# Patient Record
Sex: Female | Born: 1970
Health system: Southern US, Community
[De-identification: ages and names within clinical notes are randomized; demographics above are authoritative.]

## PROBLEM LIST (undated history)

## (undated) DIAGNOSIS — F32A Depression, unspecified: Secondary | ICD-10-CM

## (undated) DIAGNOSIS — K645 Perianal venous thrombosis: Secondary | ICD-10-CM

## (undated) DIAGNOSIS — G248 Other dystonia: Secondary | ICD-10-CM

## (undated) DIAGNOSIS — E229 Hyperfunction of pituitary gland, unspecified: Secondary | ICD-10-CM

## (undated) DIAGNOSIS — D239 Other benign neoplasm of skin, unspecified: Secondary | ICD-10-CM

## (undated) DIAGNOSIS — G43719 Chronic migraine without aura, intractable, without status migrainosus: Secondary | ICD-10-CM

## (undated) DIAGNOSIS — E018 Other iodine-deficiency related thyroid disorders and allied conditions: Secondary | ICD-10-CM

## (undated) DIAGNOSIS — F419 Anxiety disorder, unspecified: Secondary | ICD-10-CM

## (undated) DIAGNOSIS — L219 Seborrheic dermatitis, unspecified: Secondary | ICD-10-CM

## (undated) DIAGNOSIS — G241 Genetic torsion dystonia: Secondary | ICD-10-CM

## (undated) DIAGNOSIS — N926 Irregular menstruation, unspecified: Secondary | ICD-10-CM

## (undated) DIAGNOSIS — D708 Other neutropenia: Secondary | ICD-10-CM

## (undated) DIAGNOSIS — I959 Hypotension, unspecified: Secondary | ICD-10-CM

## (undated) DIAGNOSIS — R269 Unspecified abnormalities of gait and mobility: Secondary | ICD-10-CM

## (undated) HISTORY — DX: Other iodine-deficiency related thyroid disorders and allied conditions: E01.8

## (undated) HISTORY — DX: Irregular menstruation, unspecified: N92.6

## (undated) HISTORY — DX: Seborrheic dermatitis, unspecified: L21.9

## (undated) HISTORY — DX: Other benign neoplasm of skin, unspecified: D23.9

## (undated) HISTORY — DX: Genetic torsion dystonia: G24.1

## (undated) HISTORY — PX: HYMENECTOMY: SHX987

## (undated) HISTORY — DX: Perianal venous thrombosis: K64.5

## (undated) HISTORY — DX: Hyperfunction of pituitary gland, unspecified: E22.9

## (undated) HISTORY — DX: Chronic migraine without aura, intractable, without status migrainosus: G43.719

## (undated) HISTORY — DX: Unspecified abnormalities of gait and mobility: R26.9

## (undated) HISTORY — DX: Other neutropenia: D70.8

## (undated) HISTORY — DX: Other dystonia: G24.8

---

## 2010-03-31 DIAGNOSIS — L219 Seborrheic dermatitis, unspecified: Secondary | ICD-10-CM | POA: Insufficient documentation

## 2010-05-05 DIAGNOSIS — E018 Other iodine-deficiency related thyroid disorders and allied conditions: Secondary | ICD-10-CM | POA: Insufficient documentation

## 2010-07-27 DIAGNOSIS — R197 Diarrhea, unspecified: Secondary | ICD-10-CM | POA: Insufficient documentation

## 2010-07-27 DIAGNOSIS — D708 Other neutropenia: Secondary | ICD-10-CM | POA: Insufficient documentation

## 2010-09-30 DIAGNOSIS — D239 Other benign neoplasm of skin, unspecified: Secondary | ICD-10-CM | POA: Insufficient documentation

## 2010-11-24 DIAGNOSIS — M25569 Pain in unspecified knee: Secondary | ICD-10-CM | POA: Insufficient documentation

## 2011-03-18 DIAGNOSIS — R269 Unspecified abnormalities of gait and mobility: Secondary | ICD-10-CM | POA: Insufficient documentation

## 2011-04-01 DIAGNOSIS — G241 Genetic torsion dystonia: Secondary | ICD-10-CM | POA: Insufficient documentation

## 2013-07-05 DIAGNOSIS — Z01419 Encounter for gynecological examination (general) (routine) without abnormal findings: Secondary | ICD-10-CM | POA: Insufficient documentation

## 2014-04-22 DIAGNOSIS — N926 Irregular menstruation, unspecified: Secondary | ICD-10-CM | POA: Insufficient documentation

## 2016-09-23 DIAGNOSIS — G43719 Chronic migraine without aura, intractable, without status migrainosus: Secondary | ICD-10-CM | POA: Insufficient documentation

## 2017-05-17 DIAGNOSIS — R634 Abnormal weight loss: Secondary | ICD-10-CM | POA: Insufficient documentation

## 2017-06-24 DIAGNOSIS — K645 Perianal venous thrombosis: Secondary | ICD-10-CM | POA: Insufficient documentation

## 2017-11-03 DIAGNOSIS — Z23 Encounter for immunization: Secondary | ICD-10-CM | POA: Diagnosis not present

## 2017-11-15 DIAGNOSIS — D839 Common variable immunodeficiency, unspecified: Secondary | ICD-10-CM | POA: Insufficient documentation

## 2017-11-15 DIAGNOSIS — G248 Other dystonia: Secondary | ICD-10-CM | POA: Diagnosis not present

## 2017-12-28 DIAGNOSIS — F331 Major depressive disorder, recurrent, moderate: Secondary | ICD-10-CM | POA: Diagnosis not present

## 2018-01-03 DIAGNOSIS — F331 Major depressive disorder, recurrent, moderate: Secondary | ICD-10-CM | POA: Diagnosis not present

## 2018-01-12 DIAGNOSIS — F331 Major depressive disorder, recurrent, moderate: Secondary | ICD-10-CM | POA: Diagnosis not present

## 2018-01-19 DIAGNOSIS — F331 Major depressive disorder, recurrent, moderate: Secondary | ICD-10-CM | POA: Diagnosis not present

## 2018-01-25 DIAGNOSIS — F331 Major depressive disorder, recurrent, moderate: Secondary | ICD-10-CM | POA: Diagnosis not present

## 2018-02-01 DIAGNOSIS — F331 Major depressive disorder, recurrent, moderate: Secondary | ICD-10-CM | POA: Diagnosis not present

## 2018-02-03 DIAGNOSIS — G248 Other dystonia: Secondary | ICD-10-CM | POA: Diagnosis not present

## 2018-02-08 DIAGNOSIS — F331 Major depressive disorder, recurrent, moderate: Secondary | ICD-10-CM | POA: Diagnosis not present

## 2018-03-07 DIAGNOSIS — D839 Common variable immunodeficiency, unspecified: Secondary | ICD-10-CM | POA: Diagnosis not present

## 2018-03-07 DIAGNOSIS — G249 Dystonia, unspecified: Secondary | ICD-10-CM | POA: Diagnosis not present

## 2018-03-07 DIAGNOSIS — Z923 Personal history of irradiation: Secondary | ICD-10-CM | POA: Diagnosis not present

## 2018-03-07 DIAGNOSIS — G43009 Migraine without aura, not intractable, without status migrainosus: Secondary | ICD-10-CM | POA: Diagnosis not present

## 2018-03-07 DIAGNOSIS — F3341 Major depressive disorder, recurrent, in partial remission: Secondary | ICD-10-CM | POA: Diagnosis not present

## 2018-03-07 DIAGNOSIS — Z23 Encounter for immunization: Secondary | ICD-10-CM | POA: Diagnosis not present

## 2018-04-11 DIAGNOSIS — M7022 Olecranon bursitis, left elbow: Secondary | ICD-10-CM | POA: Diagnosis not present

## 2018-04-11 DIAGNOSIS — Z23 Encounter for immunization: Secondary | ICD-10-CM | POA: Diagnosis not present

## 2018-04-18 DIAGNOSIS — Z681 Body mass index (BMI) 19 or less, adult: Secondary | ICD-10-CM | POA: Diagnosis not present

## 2018-04-18 DIAGNOSIS — E890 Postprocedural hypothyroidism: Secondary | ICD-10-CM | POA: Diagnosis not present

## 2018-04-18 DIAGNOSIS — G4700 Insomnia, unspecified: Secondary | ICD-10-CM | POA: Diagnosis not present

## 2018-04-18 DIAGNOSIS — Z8639 Personal history of other endocrine, nutritional and metabolic disease: Secondary | ICD-10-CM | POA: Diagnosis not present

## 2018-04-21 DIAGNOSIS — G248 Other dystonia: Secondary | ICD-10-CM | POA: Diagnosis not present

## 2018-04-25 DIAGNOSIS — F332 Major depressive disorder, recurrent severe without psychotic features: Secondary | ICD-10-CM | POA: Diagnosis not present

## 2018-04-26 DIAGNOSIS — M7022 Olecranon bursitis, left elbow: Secondary | ICD-10-CM | POA: Diagnosis not present

## 2018-04-26 DIAGNOSIS — G43009 Migraine without aura, not intractable, without status migrainosus: Secondary | ICD-10-CM | POA: Diagnosis not present

## 2018-04-26 DIAGNOSIS — Z0000 Encounter for general adult medical examination without abnormal findings: Secondary | ICD-10-CM | POA: Diagnosis not present

## 2018-04-26 DIAGNOSIS — D839 Common variable immunodeficiency, unspecified: Secondary | ICD-10-CM | POA: Diagnosis not present

## 2018-04-27 DIAGNOSIS — F330 Major depressive disorder, recurrent, mild: Secondary | ICD-10-CM | POA: Diagnosis not present

## 2018-05-02 DIAGNOSIS — F332 Major depressive disorder, recurrent severe without psychotic features: Secondary | ICD-10-CM | POA: Diagnosis not present

## 2018-05-09 DIAGNOSIS — F411 Generalized anxiety disorder: Secondary | ICD-10-CM | POA: Diagnosis not present

## 2018-05-10 ENCOUNTER — Telehealth: Payer: Self-pay | Admitting: Hematology

## 2018-05-10 ENCOUNTER — Encounter: Payer: Self-pay | Admitting: Hematology

## 2018-05-10 NOTE — Telephone Encounter (Signed)
New referral received from Marda Stalker, PA at [facility] for dx of immunoglobulin deficiency. Pt has been cld and scheduled to see Dr. Maylon Peppers on 12/17 at 1pm. Pt aware to arrive 30 minutes early. Letter mailed.

## 2018-05-11 ENCOUNTER — Encounter: Payer: Self-pay | Admitting: Neurology

## 2018-05-11 ENCOUNTER — Ambulatory Visit (INDEPENDENT_AMBULATORY_CARE_PROVIDER_SITE_OTHER): Payer: BLUE CROSS/BLUE SHIELD | Admitting: Neurology

## 2018-05-11 VITALS — BP 95/58 | HR 72 | Ht 65.75 in | Wt 118.8 lb

## 2018-05-11 DIAGNOSIS — IMO0002 Reserved for concepts with insufficient information to code with codable children: Secondary | ICD-10-CM

## 2018-05-11 DIAGNOSIS — G43709 Chronic migraine without aura, not intractable, without status migrainosus: Secondary | ICD-10-CM | POA: Diagnosis not present

## 2018-05-12 DIAGNOSIS — G43709 Chronic migraine without aura, not intractable, without status migrainosus: Secondary | ICD-10-CM | POA: Insufficient documentation

## 2018-05-12 DIAGNOSIS — M7022 Olecranon bursitis, left elbow: Secondary | ICD-10-CM | POA: Diagnosis not present

## 2018-05-12 DIAGNOSIS — M25522 Pain in left elbow: Secondary | ICD-10-CM | POA: Diagnosis not present

## 2018-05-12 DIAGNOSIS — M7030 Other bursitis of elbow, unspecified elbow: Secondary | ICD-10-CM | POA: Insufficient documentation

## 2018-05-12 MED ORDER — RIZATRIPTAN BENZOATE 10 MG PO TBDP: 10.0000 mg | ORAL_TABLET | ORAL | 6 refills | Status: DC | PRN

## 2018-05-12 MED ORDER — FREMANEZUMAB-VFRM 225 MG/1.5ML ~~LOC~~ SOSY: 225.0000 mg | PREFILLED_SYRINGE | SUBCUTANEOUS | 11 refills | Status: DC

## 2018-05-12 NOTE — Progress Notes (Signed)
PATIENT: Rose Austin DOB: Jul 05, 1970  Chief Complaint  Patient presents with  . Migraine    She recently moved back to [place] from [place] and needs to establish care. Reports migraines started in her 93's.  She has between 9-12 headache days per month.  She uses Topamax and Cefaly for prevention.  For her pain, she uses Advil first then Zomig if it persists.  Zomig works well but she has to repeat it at times.  Marland Kitchen PCP    Marda Stalker, PA-C     HISTORICAL  Rose Austin is a 47 year old female, seen in request by her primary care PA Marda Stalker, for evaluation of migraine headaches, initial evaluation was on May 12, 2018.  I have reviewed and summarized the referring note from the referring physician.  She had a past medical history of hypothyroidism, on supplement.  She had a history of migraine headaches since her 24s, trigger for migraines are stress, sleep deprivation, weather changes, around 2017, she was getting frequent migraine headaches, her typical migraine are left-sided severe pounding headache with associated light noise sensitivity, nauseous, she was seen by neurologist, giving preventive medications Topamax 50 mg twice a day since 2018, also use Cefaly device, which has helpful,  For a while she was taking frequent over-the-counter medications ibuprofen, Excedrin Migraine, 20 migraine headache days in 1 month, she brought a migraine journal, she is now having 7-12 migraine days each month, takes Zomig 3-4 times each month as needed, but still taking frequent Excedrin Migraine, ibuprofen,  She has never tried Maxalt in the past, limited response to Imitrex.   REVIEW OF SYSTEMS: Full 14 system review of systems performed and notable only for headache, depression, anxiety, diarrhea, allergies, All other review of systems were negative.  ALLERGIES: No Known Allergies  HOME MEDICATIONS: Current Outpatient Medications  Medication Sig Dispense Refill  .  buPROPion (WELLBUTRIN SR) 150 MG 12 hr tablet Take 300 mg by mouth daily.    . cetirizine (ZYRTEC) 10 MG tablet Take 10 mg by mouth daily.    . Ciclopirox 1 % shampoo Apply topically at bedtime.    . clobetasol (TEMOVATE) 0.05 % external solution Apply 1 application topically 2 (two) times daily.    . clonazePAM (KLONOPIN) 0.5 MG tablet Take by mouth. 0.15m qam, 0.253mat noon    . Coenzyme Q10 (COQ10) 100 MG CAPS Take 100 mg by mouth 2 (two) times daily.    . fluocinonide (LIDEX) 0.05 % external solution Apply 1 application topically 2 (two) times daily.    . Marland Kitchenevothyroxine (SYNTHROID, LEVOTHROID) 137 MCG tablet Take 68.5 mcg by mouth daily before breakfast.    . Multiple Vitamin (MULTIVITAMIN) tablet Take 1 tablet by mouth daily.    . Nerve Stimulator (CEFALY KIT) DEVI by Does not apply route.    . promethazine (PHENERGAN) 25 MG tablet Take 25 mg by mouth every 6 (six) hours as needed for nausea or vomiting.    . Sod Fluoride-Potassium Nitrate (PREVIDENT 5000 ENAMEL PROTECT) 1.1-5 % PSTE Place onto teeth.    . topiramate (TOPAMAX) 50 MG tablet Take 50 mg by mouth 2 (two) times daily.    . traZODone (DESYREL) 50 MG tablet Take 25-50 mg by mouth at bedtime.    . Marland Kitchenolmitriptan (ZOMIG) 5 MG tablet Take 5 mg by mouth as needed for migraine.     No current facility-administered medications for this visit.     PAST MEDICAL HISTORY: Past Medical History:  Diagnosis Date  .  Anterior pituitary hyperfunction ([place])   . Benign neoplasm of skin   . Focal dystonia   . Gait disorder   . Genetic torsion dystonia   . Intractable chronic migraine without aura and without status migrainosus   . Iodine hypothyroidism   . Irregular menses   . Other neutropenia ([place])   . Seborrheic dermatitis   . Thrombosed external hemorrhoid     PAST SURGICAL HISTORY: Past Surgical History:  Procedure Laterality Date  . HYMENECTOMY      FAMILY HISTORY: Family History  Problem Relation Age of Onset  . Dementia  Mother   . Hypertension Mother   . Colon cancer Maternal Grandmother   . Lung cancer Paternal Grandfather   . Dementia Maternal Grandfather   . Hypertension Maternal Grandfather   . Breast cancer Paternal Grandmother     SOCIAL HISTORY: Social History   Socioeconomic History  . Marital status: Divorced    Spouse name: Not on file  . Number of children: 0  . Years of education: college  . Highest education level: Master's degree (e.g., MA, MS, MEng, MEd, MSW, MBA)  Occupational History  . 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 Financial resource strain: Not on file  . Food insecurity:    Worry: Not on file    Inability: Not on file  . Transportation needs:    Medical: Not on file    Non-medical: Not on file  Tobacco Use  . Smoking status: Never Smoker  . Smokeless tobacco: Never Used  Substance and Sexual Activity  . Alcohol use: Yes    Comment: 1-2 drinks per month  . Drug use: Never  . Sexual activity: Not on file  Lifestyle  . Physical activity:    Days per week: Not on file    Minutes per session: Not on file  . Stress: Not on file  Relationships  . Social connections:    Talks on phone: Not on file    Gets together: Not on file    Attends religious service: Not on file    Active member of club or organization: Not on file    Attends meetings of clubs or organizations: Not on file    Relationship status: Not on file  . Intimate partner violence:    Fear of current or ex partner: Not on file    Emotionally abused: Not on file    Physically abused: Not on file    Forced sexual activity: Not on file  Other Topics Concern  . Not on file  Social History Narrative   Lives at home alone.   Right-handed.   1 cup caffeine per day.     PHYSICAL EXAM   Vitals:   05/11/18 1555  BP: (!) 95/58  Pulse: 72  Weight: 118 lb 12 oz (53.9 kg)  Height: 5' 5.75" (1.67 m)    Not recorded      Body mass index is 19.31 kg/m.  PHYSICAL EXAMNIATION:  Gen:  NAD, conversant, well nourised, obese, well groomed                     Cardiovascular: Regular rate rhythm, no peripheral edema, warm, nontender. Eyes: Conjunctivae clear without exudates or hemorrhage Neck: Supple, no carotid bruits. Pulmonary: Clear to auscultation bilaterally   NEUROLOGICAL EXAM:  MENTAL STATUS: Speech:    Speech is normal; fluent and spontaneous with normal comprehension.  Cognition:     Orientation to time, place and person  Normal recent and remote memory     Normal Attention span and concentration     Normal Language, naming, repeating,spontaneous speech     Fund of knowledge   CRANIAL NERVES: CN II: Visual fields are full to confrontation. Fundoscopic exam is normal with sharp discs and no vascular changes. Pupils are round equal and briskly reactive to light. CN III, IV, VI: extraocular movement are normal. No ptosis. CN V: Facial sensation is intact to pinprick in all 3 divisions bilaterally. Corneal responses are intact.  CN VII: Face is symmetric with normal eye closure and smile. CN VIII: Hearing is normal to rubbing fingers CN IX, X: Palate elevates symmetrically. Phonation is normal. CN XI: Head turning and shoulder shrug are intact CN XII: Tongue is midline with normal movements and no atrophy.  MOTOR: There is no pronator drift of out-stretched arms. Muscle bulk and tone are normal. Muscle strength is normal.  REFLEXES: Reflexes are 2+ and symmetric at the biceps, triceps, knees, and ankles. Plantar responses are flexor.  SENSORY: Intact to light touch, pinprick, positional sensation and vibratory sensation are intact in fingers and toes.  COORDINATION: Rapid alternating movements and fine finger movements are intact. There is no dysmetria on finger-to-nose and heel-knee-shin.    GAIT/STANCE: Posture is normal. Gait is steady with normal steps, base, arm swing, and turning. Heel and toe walking are normal. Tandem gait is normal.  Romberg  is absent.   DIAGNOSTIC DATA (LABS, IMAGING, TESTING) - I reviewed patient records, labs, notes, testing and imaging myself where available.   ASSESSMENT AND PLAN  Rose Austin is a 47 y.o. female   Chronic migraine headaches Depression anxiety  Continue current preventive medication Topamax 50 mg twice a day  Add on preventive medication Ajovy 220 mg SQ as preventive medications  Maxalt as needed. May combine it with aleve, tizanidine, phenergan as needed.  RTC with NP in 3-4 months   Marcial Pacas, M.D. Ph.D.  [facility] [address] [place], [place] [address] Ph: [phone] Fax: [phone]  CC:  Marda Stalker, PA-C

## 2018-05-15 ENCOUNTER — Other Ambulatory Visit: Payer: Self-pay | Admitting: Neurology

## 2018-05-15 ENCOUNTER — Other Ambulatory Visit: Payer: Self-pay | Admitting: Hematology

## 2018-05-15 DIAGNOSIS — R768 Other specified abnormal immunological findings in serum: Secondary | ICD-10-CM

## 2018-05-15 MED ORDER — TOPIRAMATE 50 MG PO TABS: 50.0000 mg | ORAL_TABLET | Freq: Two times a day (BID) | ORAL | 3 refills | Status: DC

## 2018-05-15 MED ORDER — TIZANIDINE HCL 4 MG PO TABS: 4.0000 mg | ORAL_TABLET | Freq: Three times a day (TID) | ORAL | 5 refills | Status: DC | PRN

## 2018-05-15 NOTE — Telephone Encounter (Signed)
Pt stating medication topiramate (TOPAMAX) 50 MG tablet 90 day supply sent to [facility] mail order fax # [phone]. Also stating [facility]'t [facility] the muscle relaxer (unsure of name)

## 2018-05-15 NOTE — Progress Notes (Signed)
[facility] NOTE  Patient Care Team: Marda Stalker, PA-C as PCP - General (Family Medicine)  HEME/ONC OVERVIEW: 1. Low immunoglobulin -06/2017: routine labs showed borderline low IgG (mid-600's) and IgA (~50), nl IgM; CBC nl   PERTINENT NON-HEM/ONC PROBLEMS: 1. Hx of Graves disease s/p radioactive iodine treatment in 2000 2. Migraine on Ajovy  ASSESSMENT & PLAN:   Low immunoglobulin levels -I reviewed the patient's external records, including PCP clinic notes and lab studies -In summary, patient had significant weight loss during her divorce in 2018, thought to be stress-related; nonetheless, she underwent extensive work-up to rule out malabsorption, malignancy, and autoimmune disorders; as part of work-up for Celiac disease, she was found with mildly low IgA and IgG levels, for which she was referred to a hematologist in [place], who recommended monitoring  -Clinically, patient denies any recurrent infections, such as upper respiratory tract infections, abdominal bloating symptoms, diarrhea, or skin rash -Repeat immunoglobulin panel and viral studies (including HIV, hepatitis B/C) are pending today -Patient has a chronic migraine, for which she has been treated with and is currently on monoclonal antibody, which may cause mild lymphopenia with decreased immunoglobulin production -Furthermore, patient has history of Graves' disease, for which she was treated with radioactive iodine 2000, which can cause chronic, albeit mild, immunosuppression that can lead to lower immunoglobulin levels  -Given normal CBC, underlying lymphoproliferative disorder is less likely -In the absence of clinically significant symptoms, there is no role for IVIG -Also discussed with the patient that routine monitoring of immunoglobulin levels in the absence of clinically suspicious symptoms are unlikely to change management, and that patient can be monitored by her PCP -However, patient  expressed preference to follow up with hematology at least one more time before being discharged back to her PCP -I counseled the patient on importance of vaccinations, including influenza vaccine  Orders Placed This Encounter  Procedures  . CBC with Differential ([facility] Only)    Standing Status:   Future    Standing Expiration Date:   06/27/2019  . CMP ([facility] [place] only)    Standing Status:   Future    Standing Expiration Date:   06/27/2019  . IgG, IgA, IgM    Standing Status:   Future    Standing Expiration Date:   06/27/2019   All questions were answered. The patient knows to call the clinic with any problems, questions or concerns.  A total of more than 45 minutes were spent face-to-face with the patient during this encounter and over half of that time was spent on counseling and coordination of care as outlined above.   Due to the patient's preference to be followed in [place], I will set up patient for follow-up in 6 months with Dr. Walden Field or NP [place].   Tish Men, MD 05/23/2018 1:54 PM   CHIEF COMPLAINTS/PURPOSE OF CONSULTATION:  "I am here for low immunoglobulins"  HISTORY OF PRESENTING ILLNESS:  Rose Austin 47 y.o. female is here because of mildly low immunoglobulin levels.  Patient reports that she went to a lengthy divorce from July to November 2018, during which period she had a significant weight loss, thought due to be stress related.  Nonetheless, she underwent extensive work-up for the weight loss to rule out malignancy, autoimmune conditions, and malabsorption.  As part of her work-up for celiac disease, she was found with mildly low IgG and IgA, for which she was referred to a hematologist in [place].  Per patient, the hematologist did  not recommend any intervention except to maintain up-to-date vaccinations, including pneumonia vaccine, hepatitis B vaccine and influenza vaccine.  Patient denies any history of recurrent or frequent infections,  requiring frequent antibiotic prescription, or hospital admission for severe infection.  She denies any fever, chill, weight change, night sweats, or lymphadenopathy.  She has never had a blood transfusion in the past.  There is no family history of blood disorder or lymphoproliferative disease.  MEDICAL HISTORY:  Past Medical History:  Diagnosis Date  . Anterior pituitary hyperfunction ([place])   . Benign neoplasm of skin   . Focal dystonia   . Gait disorder   . Genetic torsion dystonia   . Intractable chronic migraine without aura and without status migrainosus   . Iodine hypothyroidism   . Irregular menses   . Other neutropenia ([place])   . Seborrheic dermatitis   . Thrombosed external hemorrhoid     SURGICAL HISTORY: Past Surgical History:  Procedure Laterality Date  . HYMENECTOMY      SOCIAL HISTORY: Social History   Socioeconomic History  . Marital status: Divorced    Spouse name: Not on file  . Number of children: 0  . Years of education: college  . Highest education level: Master's degree (e.g., MA, MS, MEng, MEd, MSW, MBA)  Occupational History  . 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 Financial resource strain: Not on file  . Food insecurity:    Worry: Not on file    Inability: Not on file  . Transportation needs:    Medical: Not on file    Non-medical: Not on file  Tobacco Use  . Smoking status: Never Smoker  . Smokeless tobacco: Never Used  Substance and Sexual Activity  . Alcohol use: Yes    Comment: 1-2 drinks per month  . Drug use: Never  . Sexual activity: Not on file  Lifestyle  . Physical activity:    Days per week: Not on file    Minutes per session: Not on file  . Stress: Not on file  Relationships  . Social connections:    Talks on phone: Not on file    Gets together: Not on file    Attends religious service: Not on file    Active member of club or organization: Not on file    Attends meetings of clubs or organizations: Not on file     Relationship status: Not on file  . Intimate partner violence:    Fear of current or ex partner: Not on file    Emotionally abused: Not on file    Physically abused: Not on file    Forced sexual activity: Not on file  Other Topics Concern  . Not on file  Social History Narrative   Lives at home alone.   Right-handed.   1 cup caffeine per day.    FAMILY HISTORY: Family History  Problem Relation Age of Onset  . Dementia Mother   . Hypertension Mother   . Colon cancer Maternal Grandmother   . Lung cancer Paternal Grandfather   . Dementia Maternal Grandfather   . Hypertension Maternal Grandfather   . Breast cancer Paternal Grandmother     ALLERGIES:   has No Known Allergies.  MEDICATIONS:  Current Outpatient Medications  Medication Sig Dispense Refill  . buPROPion (WELLBUTRIN SR) 150 MG 12 hr tablet Take 300 mg by mouth daily.    . cetirizine (ZYRTEC) 10 MG tablet Take 10 mg by mouth daily.    . Ciclopirox  1 % shampoo Apply topically at bedtime.    . clobetasol (TEMOVATE) 0.05 % external solution Apply 1 application topically 2 (two) times daily.    . clonazePAM (KLONOPIN) 0.5 MG tablet Take by mouth. 0.109m qam, 0.255mat noon    . Coenzyme Q10 (COQ10) 100 MG CAPS Take 100 mg by mouth 2 (two) times daily.    . fluocinonide ointment (LIDEX) 0.0.62 Apply 1 application topically as directed.    . Fremanezumab-vfrm (AJOVY) 225 MG/1.5ML SOSY Inject 225 mg into the skin every 30 (thirty) days. 1 Syringe 11  . levothyroxine (SYNTHROID, LEVOTHROID) 137 MCG tablet Take 68.5 mcg by mouth daily before breakfast.    . Multiple Vitamin (MULTIVITAMIN) tablet Take 1 tablet by mouth daily.    . Nerve Stimulator (CEFALY KIT) DEVI by Does not apply route.    . promethazine (PHENERGAN) 25 MG tablet Take 25 mg by mouth every 6 (six) hours as needed for nausea or vomiting.    . rizatriptan (MAXALT-MLT) 10 MG disintegrating tablet Take 1 tablet (10 mg total) by mouth as needed. May repeat in 2  hours if needed 15 tablet 6  . Sod Fluoride-Potassium Nitrate (PREVIDENT 5000 ENAMEL PROTECT) 1.1-5 % PSTE Place onto teeth.    . Marland KitcheniZANidine (ZANAFLEX) 4 MG tablet Take 1 tablet (4 mg total) by mouth every 8 (eight) hours as needed for muscle spasms. 20 tablet 5  . topiramate (TOPAMAX) 50 MG tablet Take 1 tablet (50 mg total) by mouth 2 (two) times daily. 180 tablet 3  . traZODone (DESYREL) 50 MG tablet Take 25-50 mg by mouth at bedtime.    . vortioxetine HBr (TRINTELLIX) 5 MG TABS tablet Take 5 mg by mouth daily.     No current facility-administered medications for this visit.     REVIEW OF SYSTEMS:   Constitutional: ( - ) fevers, ( - )  chills , ( - ) night sweats Eyes: ( - ) blurriness of vision, ( - ) double vision, ( - ) watery eyes Ears, nose, mouth, throat, and face: ( - ) mucositis, ( - ) sore throat Respiratory: ( - ) cough, ( - ) dyspnea, ( - ) wheezes Cardiovascular: ( - ) palpitation, ( - ) chest discomfort, ( - ) lower extremity swelling Gastrointestinal:  ( - ) nausea, ( - ) heartburn, ( - ) change in bowel habits Skin: ( - ) abnormal skin rashes Lymphatics: ( - ) new lymphadenopathy, ( - ) easy bruising Neurological: ( - ) numbness, ( - ) tingling, ( - ) new weaknesses Behavioral/Psych: ( - ) mood change, ( - ) new changes  All other systems were reviewed with the patient and are negative.  PHYSICAL EXAMINATION: ECOG PERFORMANCE STATUS: 1 - Symptomatic but completely ambulatory  Vitals:   05/23/18 1306  BP: 102/67  Pulse: (!) 58  Resp: 16  Temp: 97.7 F (36.5 C)  SpO2: 100%   Filed Weights   05/23/18 1306  Weight: 119 lb 6.4 oz (54.2 kg)    GENERAL: alert, no distress and comfortable, thin SKIN: skin color, texture, turgor are normal, no rashes or significant lesions EYES: conjunctiva are pink and non-injected, sclera clear OROPHARYNX: no exudate, no erythema; lips, buccal mucosa, and tongue normal  NECK: supple, non-tender LYMPH:  no palpable  lymphadenopathy in the cervical, axillary or inguinal LUNGS: clear to auscultation and percussion with normal breathing effort HEART: regular rate & rhythm and no murmurs and no lower extremity edema ABDOMEN: soft, non-tender, non-distended,  normal bowel sounds Musculoskeletal: no cyanosis of digits and no clubbing  PSYCH: alert & oriented x 3, fluent speech NEURO: no focal motor/sensory deficits  LABORATORY DATA:  I have reviewed the data as listed Lab Results  Component Value Date   WBC 4.0 05/23/2018   HGB 12.7 05/23/2018   HCT 38.7 05/23/2018   MCV 94.4 05/23/2018   PLT 149 (L) 05/23/2018   Recent Labs    05/23/18 1241  NA 140  K 3.9  CL 107  CO2 27  GLUCOSE 80  BUN 22*  CREATININE 0.80  CALCIUM 8.8*  GFRNONAA >60  GFRAA >60  PROT 6.2*  ALBUMIN 4.0  AST 19  ALT 20  ALKPHOS 53  BILITOT 0.4

## 2018-05-15 NOTE — Telephone Encounter (Signed)
Escribed topamax refill to [facility] mail order as requested  Escribed tizanidine 4mg  tab (1tab q8hr prn) #20/30days per Dr. Krista Blue.

## 2018-05-16 DIAGNOSIS — F332 Major depressive disorder, recurrent severe without psychotic features: Secondary | ICD-10-CM | POA: Diagnosis not present

## 2018-05-17 ENCOUNTER — Telehealth: Payer: Self-pay

## 2018-05-17 NOTE — Telephone Encounter (Signed)
Rn unable to do PA on cover my meds. The system could not recognize pts demographic information. PT has two last names. Rn had to call [employer]  [phone] for authorization. PA had to be completed on the phone. Authorization number is [id number] cert dates approve from 05/17/2018 to 08/16/2018.

## 2018-05-19 DIAGNOSIS — D839 Common variable immunodeficiency, unspecified: Secondary | ICD-10-CM | POA: Diagnosis not present

## 2018-05-23 ENCOUNTER — Inpatient Hospital Stay: Payer: BLUE CROSS/BLUE SHIELD

## 2018-05-23 ENCOUNTER — Encounter: Payer: Self-pay | Admitting: Hematology

## 2018-05-23 ENCOUNTER — Telehealth: Payer: Self-pay | Admitting: Hematology

## 2018-05-23 ENCOUNTER — Inpatient Hospital Stay: Payer: BLUE CROSS/BLUE SHIELD | Attending: Hematology | Admitting: Hematology

## 2018-05-23 VITALS — BP 102/67 | HR 58 | Temp 97.7°F | Resp 16 | Ht 66.0 in | Wt 119.4 lb

## 2018-05-23 DIAGNOSIS — R768 Other specified abnormal immunological findings in serum: Secondary | ICD-10-CM

## 2018-05-23 DIAGNOSIS — R7689 Other specified abnormal immunological findings in serum: Secondary | ICD-10-CM | POA: Insufficient documentation

## 2018-05-23 DIAGNOSIS — D839 Common variable immunodeficiency, unspecified: Secondary | ICD-10-CM | POA: Insufficient documentation

## 2018-05-23 LAB — CBC WITH DIFFERENTIAL (CANCER CENTER ONLY)
Abs Immature Granulocytes: 0.01 10*3/uL (ref 0.00–0.07)
Basophils Absolute: 0 10*3/uL (ref 0.0–0.1)
Basophils Relative: 1 %
Eosinophils Absolute: 0.1 10*3/uL (ref 0.0–0.5)
Eosinophils Relative: 2 %
HCT: 38.7 % (ref 36.0–46.0)
Hemoglobin: 12.7 g/dL (ref 12.0–15.0)
Immature Granulocytes: 0 %
Lymphocytes Relative: 22 %
Lymphs Abs: 0.9 10*3/uL (ref 0.7–4.0)
MCH: 31 pg (ref 26.0–34.0)
MCHC: 32.8 g/dL (ref 30.0–36.0)
MCV: 94.4 fL (ref 80.0–100.0)
Monocytes Absolute: 0.5 10*3/uL (ref 0.1–1.0)
Monocytes Relative: 12 %
NEUTROS PCT: 63 %
Neutro Abs: 2.6 10*3/uL (ref 1.7–7.7)
Platelet Count: 149 10*3/uL — ABNORMAL LOW (ref 150–400)
RBC: 4.1 MIL/uL (ref 3.87–5.11)
RDW: 12.1 % (ref 11.5–15.5)
WBC Count: 4 10*3/uL (ref 4.0–10.5)
nRBC: 0 % (ref 0.0–0.2)

## 2018-05-23 LAB — CMP (CANCER CENTER ONLY)
ALT: 20 U/L (ref 0–44)
ANION GAP: 6 (ref 5–15)
AST: 19 U/L (ref 15–41)
Albumin: 4 g/dL (ref 3.5–5.0)
Alkaline Phosphatase: 53 U/L (ref 38–126)
BUN: 22 mg/dL — ABNORMAL HIGH (ref 6–20)
CO2: 27 mmol/L (ref 22–32)
Calcium: 8.8 mg/dL — ABNORMAL LOW (ref 8.9–10.3)
Chloride: 107 mmol/L (ref 98–111)
Creatinine: 0.8 mg/dL (ref 0.44–1.00)
GFR, Est AFR Am: >60 mL/min (ref >60)
Glucose, Bld: 80 mg/dL (ref 70–99)
Potassium: 3.9 mmol/L (ref 3.5–5.1)
Sodium: 140 mmol/L (ref 135–145)
Total Bilirubin: 0.4 mg/dL (ref 0.3–1.2)
Total Protein: 6.2 g/dL — ABNORMAL LOW (ref 6.5–8.1)

## 2018-05-23 LAB — SEDIMENTATION RATE: Sed Rate: 1 mm/hr (ref 0–22)

## 2018-05-23 LAB — C-REACTIVE PROTEIN: CRP: <0.8 mg/dL (ref <1.0)

## 2018-05-23 NOTE — Telephone Encounter (Signed)
Have to schedule patient with [facility] or Judson Roch in 6 months per 12/17 los.  Printed avs.

## 2018-05-24 DIAGNOSIS — E890 Postprocedural hypothyroidism: Secondary | ICD-10-CM | POA: Diagnosis not present

## 2018-05-24 DIAGNOSIS — Z8639 Personal history of other endocrine, nutritional and metabolic disease: Secondary | ICD-10-CM | POA: Diagnosis not present

## 2018-05-24 DIAGNOSIS — F332 Major depressive disorder, recurrent severe without psychotic features: Secondary | ICD-10-CM | POA: Diagnosis not present

## 2018-05-24 LAB — HEPATITIS B SURFACE ANTIGEN: Hepatitis B Surface Ag: NEGATIVE

## 2018-05-24 LAB — HEPATITIS B CORE ANTIBODY, TOTAL: Hep B Core Total Ab: POSITIVE — AB

## 2018-05-24 LAB — HCV COMMENT:

## 2018-05-24 LAB — HEPATITIS C ANTIBODY (REFLEX): HCV Ab: <0.1 s/co ratio (ref 0.0–0.9)

## 2018-05-24 LAB — IGG, IGA, IGM
IgA: 44 mg/dL — ABNORMAL LOW (ref 87–352)
IgG (Immunoglobin G), Serum: 554 mg/dL — ABNORMAL LOW (ref 700–1600)
IgM (Immunoglobulin M), Srm: 203 mg/dL (ref 26–217)

## 2018-05-24 LAB — HIV ANTIBODY (ROUTINE TESTING W REFLEX): HIV Screen 4th Generation wRfx: NONREACTIVE

## 2018-06-01 DIAGNOSIS — F332 Major depressive disorder, recurrent severe without psychotic features: Secondary | ICD-10-CM | POA: Diagnosis not present

## 2018-06-13 DIAGNOSIS — Z8639 Personal history of other endocrine, nutritional and metabolic disease: Secondary | ICD-10-CM | POA: Diagnosis not present

## 2018-06-13 DIAGNOSIS — R5382 Chronic fatigue, unspecified: Secondary | ICD-10-CM | POA: Diagnosis not present

## 2018-06-13 DIAGNOSIS — G4700 Insomnia, unspecified: Secondary | ICD-10-CM | POA: Diagnosis not present

## 2018-06-13 DIAGNOSIS — E890 Postprocedural hypothyroidism: Secondary | ICD-10-CM | POA: Diagnosis not present

## 2018-06-14 DIAGNOSIS — F332 Major depressive disorder, recurrent severe without psychotic features: Secondary | ICD-10-CM | POA: Diagnosis not present

## 2018-06-15 DIAGNOSIS — F331 Major depressive disorder, recurrent, moderate: Secondary | ICD-10-CM | POA: Diagnosis not present

## 2018-06-22 DIAGNOSIS — F332 Major depressive disorder, recurrent severe without psychotic features: Secondary | ICD-10-CM | POA: Diagnosis not present

## 2018-07-04 DIAGNOSIS — H5213 Myopia, bilateral: Secondary | ICD-10-CM | POA: Diagnosis not present

## 2018-07-14 DIAGNOSIS — G248 Other dystonia: Secondary | ICD-10-CM | POA: Diagnosis not present

## 2018-07-14 DIAGNOSIS — Q6631 Other congenital varus deformities of feet, right foot: Secondary | ICD-10-CM | POA: Diagnosis not present

## 2018-07-27 ENCOUNTER — Encounter: Payer: Self-pay | Admitting: Hematology

## 2018-08-01 DIAGNOSIS — F332 Major depressive disorder, recurrent severe without psychotic features: Secondary | ICD-10-CM | POA: Diagnosis not present

## 2018-08-08 DIAGNOSIS — Z1159 Encounter for screening for other viral diseases: Secondary | ICD-10-CM | POA: Diagnosis not present

## 2018-08-08 DIAGNOSIS — R768 Other specified abnormal immunological findings in serum: Secondary | ICD-10-CM | POA: Diagnosis not present

## 2018-08-10 DIAGNOSIS — R319 Hematuria, unspecified: Secondary | ICD-10-CM | POA: Diagnosis not present

## 2018-08-10 DIAGNOSIS — N390 Urinary tract infection, site not specified: Secondary | ICD-10-CM | POA: Diagnosis not present

## 2018-08-15 DIAGNOSIS — F332 Major depressive disorder, recurrent severe without psychotic features: Secondary | ICD-10-CM | POA: Diagnosis not present

## 2018-08-21 DIAGNOSIS — Z87440 Personal history of urinary (tract) infections: Secondary | ICD-10-CM | POA: Diagnosis not present

## 2018-08-22 ENCOUNTER — Telehealth: Payer: Self-pay | Admitting: *Deleted

## 2018-08-22 NOTE — Telephone Encounter (Signed)
Ajovy PA approved by [employer].  Covermymeds key: [id number].  VX#[id number].  Valid through 08/21/2019.

## 2018-08-28 DIAGNOSIS — F3342 Major depressive disorder, recurrent, in full remission: Secondary | ICD-10-CM | POA: Diagnosis not present

## 2018-08-29 DIAGNOSIS — F411 Generalized anxiety disorder: Secondary | ICD-10-CM | POA: Diagnosis not present

## 2018-08-29 DIAGNOSIS — F332 Major depressive disorder, recurrent severe without psychotic features: Secondary | ICD-10-CM | POA: Diagnosis not present

## 2018-08-31 DIAGNOSIS — D225 Melanocytic nevi of trunk: Secondary | ICD-10-CM | POA: Diagnosis not present

## 2018-08-31 DIAGNOSIS — D2261 Melanocytic nevi of right upper limb, including shoulder: Secondary | ICD-10-CM | POA: Diagnosis not present

## 2018-08-31 DIAGNOSIS — L905 Scar conditions and fibrosis of skin: Secondary | ICD-10-CM | POA: Diagnosis not present

## 2018-08-31 DIAGNOSIS — D2262 Melanocytic nevi of left upper limb, including shoulder: Secondary | ICD-10-CM | POA: Diagnosis not present

## 2018-09-04 ENCOUNTER — Telehealth: Payer: Self-pay | Admitting: *Deleted

## 2018-09-04 NOTE — Telephone Encounter (Signed)
Left message that we are unable to see her on 09/05/2018, in the office, for her migraine follow up.  However, we are able to convert this appt to a virtual visit, with Dr. Krista Blue, so that her care is not delayed.  When patient calls back, please offer virtual visit.  Once she provides consent, we can call back to set up the appt.

## 2018-09-05 ENCOUNTER — Ambulatory Visit: Payer: BLUE CROSS/BLUE SHIELD | Admitting: Neurology

## 2018-09-12 DIAGNOSIS — F332 Major depressive disorder, recurrent severe without psychotic features: Secondary | ICD-10-CM | POA: Diagnosis not present

## 2018-09-12 DIAGNOSIS — F411 Generalized anxiety disorder: Secondary | ICD-10-CM | POA: Diagnosis not present

## 2018-09-22 DIAGNOSIS — E890 Postprocedural hypothyroidism: Secondary | ICD-10-CM | POA: Diagnosis not present

## 2018-09-26 DIAGNOSIS — F411 Generalized anxiety disorder: Secondary | ICD-10-CM | POA: Diagnosis not present

## 2018-09-26 DIAGNOSIS — F332 Major depressive disorder, recurrent severe without psychotic features: Secondary | ICD-10-CM | POA: Diagnosis not present

## 2018-10-03 DIAGNOSIS — F332 Major depressive disorder, recurrent severe without psychotic features: Secondary | ICD-10-CM | POA: Diagnosis not present

## 2018-10-03 DIAGNOSIS — F411 Generalized anxiety disorder: Secondary | ICD-10-CM | POA: Diagnosis not present

## 2018-10-06 DIAGNOSIS — G248 Other dystonia: Secondary | ICD-10-CM | POA: Diagnosis not present

## 2018-10-10 DIAGNOSIS — M25511 Pain in right shoulder: Secondary | ICD-10-CM | POA: Diagnosis not present

## 2018-10-10 DIAGNOSIS — M7022 Olecranon bursitis, left elbow: Secondary | ICD-10-CM | POA: Diagnosis not present

## 2018-10-12 DIAGNOSIS — M25511 Pain in right shoulder: Secondary | ICD-10-CM | POA: Diagnosis not present

## 2018-10-17 DIAGNOSIS — F332 Major depressive disorder, recurrent severe without psychotic features: Secondary | ICD-10-CM | POA: Diagnosis not present

## 2018-10-17 DIAGNOSIS — F411 Generalized anxiety disorder: Secondary | ICD-10-CM | POA: Diagnosis not present

## 2018-10-23 ENCOUNTER — Telehealth: Payer: Self-pay | Admitting: Neurology

## 2018-10-23 MED ORDER — PROMETHAZINE HCL 25 MG PO TABS: 25.0000 mg | ORAL_TABLET | Freq: Four times a day (QID) | ORAL | 6 refills | Status: DC | PRN

## 2018-10-23 NOTE — Telephone Encounter (Signed)
Hi Dr. Krista Blue,    [facility] you are well. I need a refill of Promethazine 25mg , and since I take it PRN, this would be the first time you'd be prescribing it for me as a fairly new patient. So I couldn't put the request through the refill section of MyChart or through my pharmacy.    If I may please request that [facility] [address] [phone], I'd greatly appreciate it. Since I take it PRN, it isn't something I have to get a 90-day supply of through mail order.    The Ajovy is helping my migraines A LOT! Thank you!    ~ Rose Austin   Refilled Phenergan 25mg  as needed.

## 2018-11-03 ENCOUNTER — Encounter: Payer: Self-pay | Admitting: Hematology

## 2018-11-07 DIAGNOSIS — F331 Major depressive disorder, recurrent, moderate: Secondary | ICD-10-CM | POA: Diagnosis not present

## 2018-11-21 DIAGNOSIS — F332 Major depressive disorder, recurrent severe without psychotic features: Secondary | ICD-10-CM | POA: Diagnosis not present

## 2018-11-21 DIAGNOSIS — F411 Generalized anxiety disorder: Secondary | ICD-10-CM | POA: Diagnosis not present

## 2018-11-27 ENCOUNTER — Ambulatory Visit: Payer: BLUE CROSS/BLUE SHIELD | Admitting: Neurology

## 2018-11-30 ENCOUNTER — Emergency Department (HOSPITAL_COMMUNITY)
Admission: EM | Admit: 2018-11-30 | Discharge: 2018-11-30 | Disposition: A | Discharge status: Home / Self Care | Payer: BC Managed Care – PPO | Attending: Emergency Medicine | Admitting: Emergency Medicine

## 2018-11-30 ENCOUNTER — Encounter (HOSPITAL_COMMUNITY): Payer: Self-pay | Admitting: Emergency Medicine

## 2018-11-30 ENCOUNTER — Other Ambulatory Visit: Payer: Self-pay

## 2018-11-30 DIAGNOSIS — N12 Tubulo-interstitial nephritis, not specified as acute or chronic: Secondary | ICD-10-CM

## 2018-11-30 DIAGNOSIS — N10 Acute tubulo-interstitial nephritis: Secondary | ICD-10-CM | POA: Insufficient documentation

## 2018-11-30 DIAGNOSIS — Z79899 Other long term (current) drug therapy: Secondary | ICD-10-CM | POA: Diagnosis not present

## 2018-11-30 DIAGNOSIS — G249 Dystonia, unspecified: Secondary | ICD-10-CM | POA: Diagnosis not present

## 2018-11-30 DIAGNOSIS — R110 Nausea: Secondary | ICD-10-CM | POA: Diagnosis not present

## 2018-11-30 DIAGNOSIS — R630 Anorexia: Secondary | ICD-10-CM | POA: Diagnosis not present

## 2018-11-30 DIAGNOSIS — R1031 Right lower quadrant pain: Secondary | ICD-10-CM | POA: Diagnosis not present

## 2018-11-30 DIAGNOSIS — R5383 Other fatigue: Secondary | ICD-10-CM | POA: Insufficient documentation

## 2018-11-30 DIAGNOSIS — M549 Dorsalgia, unspecified: Secondary | ICD-10-CM | POA: Diagnosis not present

## 2018-11-30 DIAGNOSIS — R109 Unspecified abdominal pain: Secondary | ICD-10-CM | POA: Diagnosis not present

## 2018-11-30 LAB — COMPREHENSIVE METABOLIC PANEL
ALT: 21 U/L (ref 0–44)
AST: 24 U/L (ref 15–41)
Albumin: 4.5 g/dL (ref 3.5–5.0)
Alkaline Phosphatase: 46 U/L (ref 38–126)
Anion gap: 9 (ref 5–15)
BUN: 16 mg/dL (ref 6–20)
CO2: 23 mmol/L (ref 22–32)
Calcium: 9 mg/dL (ref 8.9–10.3)
Chloride: 105 mmol/L (ref 98–111)
Creatinine, Ser: 0.85 mg/dL (ref 0.44–1.00)
GFR calc Af Amer: >60 mL/min (ref >60)
GFR calc non Af Amer: >60 mL/min (ref >60)
Glucose, Bld: 75 mg/dL (ref 70–99)
Potassium: 3.7 mmol/L (ref 3.5–5.1)
Sodium: 137 mmol/L (ref 135–145)
Total Bilirubin: 0.8 mg/dL (ref 0.3–1.2)
Total Protein: 6.7 g/dL (ref 6.5–8.1)

## 2018-11-30 LAB — CBC WITH DIFFERENTIAL/PLATELET
Abs Immature Granulocytes: 0.01 10*3/uL (ref 0.00–0.07)
Basophils Absolute: 0 10*3/uL (ref 0.0–0.1)
Basophils Relative: 1 %
Eosinophils Absolute: 0.1 10*3/uL (ref 0.0–0.5)
Eosinophils Relative: 2 %
HCT: 43 % (ref 36.0–46.0)
Hemoglobin: 14.1 g/dL (ref 12.0–15.0)
Immature Granulocytes: 0 %
Lymphocytes Relative: 19 %
Lymphs Abs: 0.8 10*3/uL (ref 0.7–4.0)
MCH: 30.9 pg (ref 26.0–34.0)
MCHC: 32.8 g/dL (ref 30.0–36.0)
MCV: 94.1 fL (ref 80.0–100.0)
Monocytes Absolute: 0.4 10*3/uL (ref 0.1–1.0)
Monocytes Relative: 10 %
Neutro Abs: 2.9 10*3/uL (ref 1.7–7.7)
Neutrophils Relative %: 68 %
Platelets: 173 10*3/uL (ref 150–400)
RBC: 4.57 MIL/uL (ref 3.87–5.11)
RDW: 12 % (ref 11.5–15.5)
WBC: 4.3 10*3/uL (ref 4.0–10.5)
nRBC: 0 % (ref 0.0–0.2)

## 2018-11-30 LAB — I-STAT BETA HCG BLOOD, ED (MC, WL, AP ONLY): I-stat hCG, quantitative: <5 m[IU]/mL (ref <5)

## 2018-11-30 LAB — URINALYSIS, ROUTINE W REFLEX MICROSCOPIC
Bilirubin Urine: NEGATIVE
Glucose, UA: NEGATIVE mg/dL
Hgb urine dipstick: NEGATIVE
Ketones, ur: NEGATIVE mg/dL
Nitrite: POSITIVE — AB
Protein, ur: NEGATIVE mg/dL
Specific Gravity, Urine: 1.008 (ref 1.005–1.030)
pH: 5 (ref 5.0–8.0)

## 2018-11-30 LAB — LIPASE, BLOOD: Lipase: 30 U/L (ref 11–51)

## 2018-11-30 MED ORDER — KETOROLAC TROMETHAMINE 30 MG/ML IJ SOLN
15.0000 mg | Freq: Once | INTRAMUSCULAR | Status: AC
  Administered 2018-11-30: 15 mg via INTRAVENOUS
  Filled 2018-11-30: qty 1

## 2018-11-30 MED ORDER — SODIUM CHLORIDE 0.9 % IV SOLN
1.0000 g | Freq: Once | INTRAVENOUS | Status: AC
  Administered 2018-11-30: 1 g via INTRAVENOUS
  Filled 2018-11-30: qty 10

## 2018-11-30 MED ORDER — CEPHALEXIN 500 MG PO CAPS: 500.0000 mg | ORAL_CAPSULE | Freq: Four times a day (QID) | ORAL | 0 refills | Status: AC

## 2018-11-30 MED ORDER — SODIUM CHLORIDE 0.9 % IV BOLUS
500.0000 mL | Freq: Once | INTRAVENOUS | Status: AC
  Administered 2018-11-30: 12:00:00 500 mL via INTRAVENOUS

## 2018-11-30 NOTE — ED Triage Notes (Signed)
Pt endorses flank and RLQ pain with nausea since Sunday. Reports loss of appetite, light-headedness and fatigue.

## 2018-11-30 NOTE — ED Provider Notes (Signed)
[facility] EMERGENCY DEPARTMENT Provider Note   CSN: [id number] Arrival date & time: 11/30/18  1127    History   Chief Complaint Chief Complaint  Patient presents with  . Abdominal Pain    HPI Rose Austin is a 48 y.o. female with a past medical history of dystonia, migraines who presents to ED for evaluation of nausea, decreased appetite and fatigue since 11/26/2018 and sharp, right-sided flank and back pain since yesterday.  States that the pain has been constant, worse with movement and palpation.  No improvement with ibuprofen or Tylenol which usually relieves back pain.  Phenergan has helped with the nausea somewhat.  Denies any urinary symptoms, vaginal complaints, fever, shortness of breath, chest pain, vomiting, history of kidney stones, prior abdominal surgeries, alcohol, tobacco or drug use.     HPI  Past Medical History:  Diagnosis Date  . Anterior pituitary hyperfunction ([place])   . Benign neoplasm of skin   . Focal dystonia   . Gait disorder   . Genetic torsion dystonia   . Intractable chronic migraine without aura and without status migrainosus   . Iodine hypothyroidism   . Irregular menses   . Other neutropenia ([place])   . Seborrheic dermatitis   . Thrombosed external hemorrhoid     Patient Active Problem List   Diagnosis Date Noted  . Low immunoglobulin level 05/23/2018  . Chronic migraine 05/12/2018    Past Surgical History:  Procedure Laterality Date  . HYMENECTOMY       OB History   No obstetric history on file.      Home Medications    Prior to Admission medications   Medication Sig Start Date End Date Taking? Authorizing Provider  buPROPion (WELLBUTRIN SR) 150 MG 12 hr tablet Take 300 mg by mouth daily.   Yes [provider]  cetirizine (ZYRTEC) 10 MG tablet Take 10 mg by mouth daily.   Yes [provider]  Ciclopirox 1 % shampoo Apply 1 application topically at bedtime.    Yes [provider]  clobetasol (TEMOVATE) 0.05 % external solution Apply 1 application topically 2 (two) times daily.   Yes [provider]  clonazePAM (KLONOPIN) 0.5 MG tablet Take 0.25-0.75 mg by mouth See admin instructions. Take 0.'75mg'$  qam, 0.'25mg'$  at noon   Yes [provider]  Coenzyme Q10 (COQ10) 100 MG CAPS Take 100 mg by mouth 2 (two) times daily.   Yes [provider]  fluocinonide ointment (LIDEX) 7.03 % Apply 1 application topically 2 (two) times daily as needed (Dermatitis). Hands   Yes [provider]  Fremanezumab-vfrm (AJOVY) 225 MG/1.5ML SOSY Inject 225 mg into the skin every 30 (thirty) days. 05/12/18  Yes Marcial Pacas, MD  L-Methylfolate (DEPLIN PO) Take 1 tablet by mouth at bedtime.    Yes [provider]  levothyroxine (SYNTHROID, LEVOTHROID) 137 MCG tablet Take 137 mcg by mouth See admin instructions. All days  EXCEPT: Sunday   Yes [provider]  Multiple Vitamin (MULTIVITAMIN) tablet Take 1 tablet by mouth at bedtime.    Yes [provider]  Nerve Stimulator (CEFALY KIT) DEVI by Does not apply route daily.    Yes [provider]  promethazine (PHENERGAN) 25 MG tablet Take 1 tablet (25 mg total) by mouth every 6 (six) hours as needed for nausea or vomiting. 10/23/18  Yes Marcial Pacas, MD  rizatriptan (MAXALT-MLT) 10 MG disintegrating tablet Take 1 tablet (10 mg total) by mouth as needed. May repeat in  2 hours if needed Patient taking differently: Take 10 mg by mouth as needed for migraine. May repeat in 2 hours if needed 05/12/18  Yes Marcial Pacas, MD  Sod Fluoride-Potassium Nitrate (PREVIDENT 5000 ENAMEL PROTECT) 1.1-5 % PSTE Place 1 application onto teeth at bedtime.    Yes [provider]  tiZANidine (ZANAFLEX) 4 MG tablet Take 1 tablet (4 mg total) by mouth every 8 (eight) hours as needed for muscle spasms. 05/15/18  Yes Marcial Pacas, MD  topiramate (TOPAMAX) 50 MG tablet Take 1 tablet (50 mg total) by mouth 2 (two) times  daily. 05/15/18  Yes Marcial Pacas, MD  traZODone (DESYREL) 50 MG tablet Take 25-50 mg by mouth at bedtime.   Yes [provider]  vortioxetine HBr (TRINTELLIX) 5 MG TABS tablet Take 15 mg by mouth daily.    Yes [provider]  cephALEXin (KEFLEX) 500 MG capsule Take 1 capsule (500 mg total) by mouth 4 (four) times daily for 10 days. 11/30/18 12/10/18  Delia Heady, PA-C    Family History Family History  Problem Relation Age of Onset  . Dementia Mother   . Hypertension Mother   . Colon cancer Maternal Grandmother   . Lung cancer Paternal Grandfather   . Dementia Maternal Grandfather   . Hypertension Maternal Grandfather   . Breast cancer Paternal Grandmother     Social History Social History   Tobacco Use  . Smoking status: Never Smoker  . Smokeless tobacco: Never Used  Substance Use Topics  . Alcohol use: Yes    Comment: 1-2 drinks per month  . Drug use: Never     Allergies   Patient has no known allergies.   Review of Systems Review of Systems  Constitutional: Negative for appetite change, chills and fever.  HENT: Negative for ear pain, rhinorrhea, sneezing and sore throat.   Eyes: Negative for photophobia and visual disturbance.  Respiratory: Negative for cough, chest tightness, shortness of breath and wheezing.   Cardiovascular: Negative for chest pain and palpitations.  Gastrointestinal: Positive for abdominal pain and nausea. Negative for blood in stool, constipation, diarrhea and vomiting.  Genitourinary: Positive for flank pain. Negative for dysuria, hematuria and urgency.  Musculoskeletal: Negative for myalgias.  Skin: Negative for rash.  Neurological: Negative for dizziness, weakness and light-headedness.     Physical Exam Updated Vital Signs BP 102/72 (BP Location: Right Arm)   Pulse (!) 50   Temp 98.6 F (37 C) (Oral)   Resp 18   Ht '5\' 6"'$  (1.676 m)   Wt 52.6 kg   SpO2 99%   BMI 18.72 kg/m   Physical Exam Vitals signs and nursing  note reviewed.  Constitutional:      General: She is not in acute distress.    Appearance: She is well-developed.  HENT:     Head: Normocephalic and atraumatic.     Nose: Nose normal.  Eyes:     General: No scleral icterus.       Left eye: No discharge.     Conjunctiva/sclera: Conjunctivae normal.  Neck:     Musculoskeletal: Normal range of motion and neck supple.  Cardiovascular:     Rate and Rhythm: Normal rate and regular rhythm.     Heart sounds: Normal heart sounds. No murmur. No friction rub. No gallop.   Pulmonary:     Effort: Pulmonary effort is normal. No respiratory distress.     Breath sounds: Normal breath sounds.  Abdominal:     General: Bowel sounds are  normal. There is no distension.     Palpations: Abdomen is soft.     Tenderness: There is no abdominal tenderness. There is right CVA tenderness. There is no guarding.  Musculoskeletal: Normal range of motion.  Skin:    General: Skin is warm and dry.     Findings: No rash.  Neurological:     Mental Status: She is alert.     Motor: No abnormal muscle tone.     Coordination: Coordination normal.      ED Treatments / Results  Labs (all labs ordered are listed, but only abnormal results are displayed) Labs Reviewed  URINALYSIS, ROUTINE W REFLEX MICROSCOPIC - Abnormal; Notable for the following components:      Result Value   Nitrite POSITIVE (*)    Leukocytes,Ua SMALL (*)    Bacteria, UA RARE (*)    All other components within normal limits  URINE CULTURE  COMPREHENSIVE METABOLIC PANEL  CBC WITH DIFFERENTIAL/PLATELET  LIPASE, BLOOD  I-STAT BETA HCG BLOOD, ED ([facility], [facility], [facility] ONLY)    EKG None  Radiology No results found.  Procedures Procedures (including critical care time)  Medications Ordered in ED Medications  sodium chloride 0.9 % bolus 500 mL (0 mLs Intravenous Stopped 11/30/18 1332)  ketorolac (TORADOL) 30 MG/ML injection 15 mg (15 mg Intravenous Given 11/30/18 1148)  cefTRIAXone (ROCEPHIN) 1 g  in sodium chloride 0.9 % 100 mL IVPB (0 g Intravenous Stopped 11/30/18 1410)     Initial Impression / Assessment and Plan / ED Course  I have reviewed the triage vital signs and the nursing notes.  Pertinent labs & imaging results that were available during my care of the patient were reviewed by me and considered in my medical decision making (see chart for details).  Clinical Course as of Nov 30 1415  Thu Nov 30, 2018  1358 hCG is negative per mini lab.   [HK]    Clinical Course User Index [HK] Delia Heady, Rose       48yo F presents to ED for nausea and R sided flank pain. Pain began yesterday and has not improved with OTC meds that typically help her back pain. Denies urinary symptoms, vaginal complaints, changes to bowel movements, vomiting or fever.  On exam patient is overall well-appearing.  Vital signs are within normal limits.  She does have right-sided CVA tenderness without abdominal tenderness or rebound or guarding.  CBC, CMP, lipase, hCG unremarkable.  Urinalysis positive for nitrites, leukocytes and bacteria.  I have sent this for culture.  Due to her lab findings and physical exam findings, most likely mild pyelonephritis.  She was given IV Rocephin here will be discharged home with remainder of antibiotic course.  Patient reports improvement in her symptoms with Toradol and IV fluids.  We will have her follow-up with PCP and return for worsening symptoms.  Patient is hemodynamically stable, in NAD, and able to ambulate in the ED. Evaluation does not show pathology that would require ongoing emergent intervention or inpatient treatment. I explained the diagnosis to the patient. Pain has been managed and has no complaints prior to discharge. Patient is comfortable with above plan and is stable for discharge at this time. All questions were answered prior to disposition. Strict return precautions for returning to the ED were discussed. Encouraged follow up with PCP.   An After  Visit Summary was printed and given to the patient.   Portions of this note were generated with [employer]. Dictation errors may  occur despite best attempts at proofreading.   Final Clinical Impressions(s) / ED Diagnoses   Final diagnoses:  Pyelonephritis    ED Discharge Orders         Ordered    cephALEXin (KEFLEX) 500 MG capsule  4 times daily     11/30/18 1354           Delia Heady, PA-C 11/30/18 1417    Quintella Reichert, MD 12/01/18 [phone]

## 2018-11-30 NOTE — ED Notes (Signed)
Patient verbalizes understanding of discharge instructions. Opportunity for questioning and answers were provided. Armband removed by staff, pt discharged from ED ambulatory to home.  

## 2018-11-30 NOTE — Discharge Instructions (Signed)
It is important for you to complete the course of antibiotics regardless of symptom improvement to prevent worsening or recurrence of your infection. We will call you if you need to be switched to a different antibiotic based on your culture results. Return to the ED if you start to have worsening symptoms, develop a fever or increased pain, uncontrolled vomiting, lightheadedness or loss of consciousness.

## 2018-12-02 LAB — URINE CULTURE: Culture: >=100000 — AB

## 2018-12-03 ENCOUNTER — Telehealth: Payer: Self-pay | Admitting: Emergency Medicine

## 2018-12-03 NOTE — Telephone Encounter (Signed)
Post ED Visit - Positive Culture Follow-up  Culture report reviewed by antimicrobial stewardship pharmacist: [facility] Team []  Nathan Batchelder, Pharm.D. []  [address], Pharm.D., BCPS AQ-ID []  Heide Guile, Pharm.D., BCPS []  Parks Neptune, Pharm.D., BCPS []  [place], Pharm.D., BCPS, AAHIVP []  [place], Pharm.D., BCPS, AAHIVP [x]  Legrand Como, PharmD, BCPS []  Salome Arnt, PharmD, BCPS []  Johnnette Gourd, PharmD, BCPS []  Hughes Better, PharmD []  Leeroy Cha, PharmD, BCPS []  Laqueta Linden, PharmD  [facility] Team []  [address], PharmD []  Leodis Sias, PharmD []  Lindell Spar, PharmD []  Royetta Asal, Rph []  Graylin Shiver) Rema Fendt, PharmD []  Glennon Mac, PharmD []  Arlyn Dunning, PharmD []  Netta Cedars, PharmD []  Dia Sitter, PharmD []  Leone Haven, PharmD []  Gretta Arab, PharmD []  Theodis Shove, PharmD []  Peggyann Juba, PharmD   Positive urine culture Treated with Cephalexin, organism sensitive to the same and no further patient follow-up is required at this time.  Rose Austin 12/03/2018, 1:33 PM

## 2018-12-06 DIAGNOSIS — Z09 Encounter for follow-up examination after completed treatment for conditions other than malignant neoplasm: Secondary | ICD-10-CM | POA: Diagnosis not present

## 2018-12-06 DIAGNOSIS — Z681 Body mass index (BMI) 19 or less, adult: Secondary | ICD-10-CM | POA: Diagnosis not present

## 2018-12-06 DIAGNOSIS — N390 Urinary tract infection, site not specified: Secondary | ICD-10-CM | POA: Diagnosis not present

## 2018-12-06 DIAGNOSIS — R109 Unspecified abdominal pain: Secondary | ICD-10-CM | POA: Diagnosis not present

## 2018-12-06 DIAGNOSIS — B379 Candidiasis, unspecified: Secondary | ICD-10-CM | POA: Diagnosis not present

## 2018-12-07 DIAGNOSIS — Z8639 Personal history of other endocrine, nutritional and metabolic disease: Secondary | ICD-10-CM | POA: Diagnosis not present

## 2018-12-07 DIAGNOSIS — R5382 Chronic fatigue, unspecified: Secondary | ICD-10-CM | POA: Diagnosis not present

## 2018-12-07 DIAGNOSIS — E890 Postprocedural hypothyroidism: Secondary | ICD-10-CM | POA: Diagnosis not present

## 2018-12-07 DIAGNOSIS — Z681 Body mass index (BMI) 19 or less, adult: Secondary | ICD-10-CM | POA: Diagnosis not present

## 2018-12-08 ENCOUNTER — Encounter (HOSPITAL_COMMUNITY): Payer: Self-pay | Admitting: Emergency Medicine

## 2018-12-08 ENCOUNTER — Other Ambulatory Visit: Payer: Self-pay

## 2018-12-08 ENCOUNTER — Emergency Department (HOSPITAL_COMMUNITY)
Admission: EM | Admit: 2018-12-08 | Discharge: 2018-12-08 | Disposition: A | Discharge status: Home / Self Care | Payer: BC Managed Care – PPO | Attending: Emergency Medicine | Admitting: Emergency Medicine

## 2018-12-08 ENCOUNTER — Emergency Department (HOSPITAL_COMMUNITY): Payer: BC Managed Care – PPO

## 2018-12-08 DIAGNOSIS — R1031 Right lower quadrant pain: Secondary | ICD-10-CM | POA: Insufficient documentation

## 2018-12-08 DIAGNOSIS — Z79899 Other long term (current) drug therapy: Secondary | ICD-10-CM | POA: Diagnosis not present

## 2018-12-08 DIAGNOSIS — R109 Unspecified abdominal pain: Secondary | ICD-10-CM | POA: Diagnosis not present

## 2018-12-08 DIAGNOSIS — R110 Nausea: Secondary | ICD-10-CM | POA: Diagnosis not present

## 2018-12-08 LAB — CBC WITH DIFFERENTIAL/PLATELET
Abs Immature Granulocytes: 0.02 10*3/uL (ref 0.00–0.07)
Basophils Absolute: 0 10*3/uL (ref 0.0–0.1)
Basophils Relative: 0 %
Eosinophils Absolute: 0.1 10*3/uL (ref 0.0–0.5)
Eosinophils Relative: 2 %
HCT: 39.6 % (ref 36.0–46.0)
Hemoglobin: 13 g/dL (ref 12.0–15.0)
Immature Granulocytes: 1 %
Lymphocytes Relative: 16 %
Lymphs Abs: 0.6 10*3/uL — ABNORMAL LOW (ref 0.7–4.0)
MCH: 30.2 pg (ref 26.0–34.0)
MCHC: 32.8 g/dL (ref 30.0–36.0)
MCV: 92.1 fL (ref 80.0–100.0)
Monocytes Absolute: 0.4 10*3/uL (ref 0.1–1.0)
Monocytes Relative: 11 %
Neutro Abs: 2.6 10*3/uL (ref 1.7–7.7)
Neutrophils Relative %: 70 %
Platelets: 166 10*3/uL (ref 150–400)
RBC: 4.3 MIL/uL (ref 3.87–5.11)
RDW: 11.9 % (ref 11.5–15.5)
WBC: 3.6 10*3/uL — ABNORMAL LOW (ref 4.0–10.5)
nRBC: 0 % (ref 0.0–0.2)

## 2018-12-08 LAB — URINALYSIS, ROUTINE W REFLEX MICROSCOPIC
Bilirubin Urine: NEGATIVE
Glucose, UA: NEGATIVE mg/dL
Hgb urine dipstick: NEGATIVE
Ketones, ur: NEGATIVE mg/dL
Nitrite: NEGATIVE
Protein, ur: NEGATIVE mg/dL
Specific Gravity, Urine: 1.006 (ref 1.005–1.030)
pH: 5 (ref 5.0–8.0)

## 2018-12-08 LAB — COMPREHENSIVE METABOLIC PANEL WITH GFR
ALT: 18 U/L (ref 0–44)
AST: 20 U/L (ref 15–41)
Albumin: 4.2 g/dL (ref 3.5–5.0)
Alkaline Phosphatase: 49 U/L (ref 38–126)
Anion gap: 8 (ref 5–15)
BUN: 13 mg/dL (ref 6–20)
CO2: 25 mmol/L (ref 22–32)
Calcium: 9 mg/dL (ref 8.9–10.3)
Chloride: 106 mmol/L (ref 98–111)
Creatinine, Ser: 0.97 mg/dL (ref 0.44–1.00)
GFR calc Af Amer: >60 mL/min
GFR calc non Af Amer: >60 mL/min
Glucose, Bld: 92 mg/dL (ref 70–99)
Potassium: 4.5 mmol/L (ref 3.5–5.1)
Sodium: 139 mmol/L (ref 135–145)
Total Bilirubin: 0.6 mg/dL (ref 0.3–1.2)
Total Protein: 6.1 g/dL — ABNORMAL LOW (ref 6.5–8.1)

## 2018-12-08 LAB — LIPASE, BLOOD: Lipase: 50 U/L (ref 11–51)

## 2018-12-08 NOTE — ED Notes (Signed)
Patient transported to CT 

## 2018-12-08 NOTE — ED Triage Notes (Signed)
Pt. Stated, I started having rt. Side pain almost 2 weeks ago and was given antibiotic  And then I saw my Dr. And she changed my meds to [place].  She told me to come here for an image and if I had any nausea.

## 2018-12-08 NOTE — ED Notes (Signed)
Patient Alert and oriented to baseline. Stable and ambulatory to baseline. Patient verbalized understanding of the discharge instructions.  Patient belongings were taken by the patient.   

## 2018-12-08 NOTE — ED Notes (Signed)
Culture was sent with urinalysis.

## 2018-12-08 NOTE — ED Provider Notes (Signed)
[facility] EMERGENCY DEPARTMENT Provider Note   CSN: [id number] Arrival date & time: 12/08/18  0957    History   Chief Complaint Chief Complaint  Patient presents with  . Abdominal Pain  . Nausea    HPI Rose Austin is a 48 y.o. female.     48yo female with right flank pain x 2 weeks. Patient was seen here 11/30/2018, diagnosed with pyelonephritis and given abx, culture grew e.coli which was pansensitive. Patient followed up with PCP for return of right lower abdominal/right low back pain, started on Macrobid, reports she had blood in her urine on office dip. Reports worsening nausea this morning, ongoing pain (intermittent, dull/sharp,, worse with changes in position, improves with IBU). Denies fevers, chills, vomiting, changes in bowel habits, last bowel movement 12/08/2018, no urinary symptoms.      Past Medical History:  Diagnosis Date  . Anterior pituitary hyperfunction ([place])   . Benign neoplasm of skin   . Focal dystonia   . Gait disorder   . Genetic torsion dystonia   . Intractable chronic migraine without aura and without status migrainosus   . Iodine hypothyroidism   . Irregular menses   . Other neutropenia ([place])   . Seborrheic dermatitis   . Thrombosed external hemorrhoid     Patient Active Problem List   Diagnosis Date Noted  . Low immunoglobulin level 05/23/2018  . Chronic migraine 05/12/2018    Past Surgical History:  Procedure Laterality Date  . HYMENECTOMY       OB History   No obstetric history on file.      Home Medications    Prior to Admission medications   Medication Sig Start Date End Date Taking? Authorizing Provider  buPROPion (WELLBUTRIN SR) 150 MG 12 hr tablet Take 300 mg by mouth daily.    [provider]  cephALEXin (KEFLEX) 500 MG capsule Take 1 capsule (500 mg total) by mouth 4 (four) times daily for 10 days. 11/30/18 12/10/18  Khatri, Hina, PA-C  cetirizine (ZYRTEC) 10 MG tablet Take 10 mg by mouth  daily.    [provider]  Ciclopirox 1 % shampoo Apply 1 application topically at bedtime.     [provider]  clobetasol (TEMOVATE) 0.05 % external solution Apply 1 application topically 2 (two) times daily.    [provider]  clonazePAM (KLONOPIN) 0.5 MG tablet Take 0.25-0.75 mg by mouth See admin instructions. Take 0.73m qam, 0.258mat noon    [provider]  Coenzyme Q10 (COQ10) 100 MG CAPS Take 100 mg by mouth 2 (two) times daily.    [provider]  fluocinonide ointment (LIDEX) 0.8.88 Apply 1 application topically 2 (two) times daily as needed (Dermatitis). Hands    [provider]  Fremanezumab-vfrm (AJOVY) 225 MG/1.5ML SOSY Inject 225 mg into the skin every 30 (thirty) days. 05/12/18   YaMarcial PacasMD  L-Methylfolate (DEPLIN PO) Take 1 tablet by mouth at bedtime.     [provider]  levothyroxine (SYNTHROID, LEVOTHROID) 137 MCG tablet Take 137 mcg by mouth See admin instructions. All days  EXCEPT: Sunday    [provider]  Multiple Vitamin (MULTIVITAMIN) tablet Take 1 tablet by mouth at bedtime.     [provider]  Nerve Stimulator (CEFALY KIT) DEVI by Does not apply route daily.     [provider]  promethazine (PHENERGAN) 25 MG tablet Take 1 tablet (25 mg total) by mouth every 6 (six) hours as needed for nausea  or vomiting. 10/23/18   Marcial Pacas, MD  rizatriptan (MAXALT-MLT) 10 MG disintegrating tablet Take 1 tablet (10 mg total) by mouth as needed. May repeat in 2 hours if needed Patient taking differently: Take 10 mg by mouth as needed for migraine. May repeat in 2 hours if needed 05/12/18   Marcial Pacas, MD  Sod Fluoride-Potassium Nitrate (PREVIDENT 5000 ENAMEL PROTECT) 1.1-5 % PSTE Place 1 application onto teeth at bedtime.     [provider]  tiZANidine (ZANAFLEX) 4 MG tablet Take 1 tablet (4 mg total) by mouth every 8 (eight) hours as needed for muscle spasms. 05/15/18   Marcial Pacas,  MD  topiramate (TOPAMAX) 50 MG tablet Take 1 tablet (50 mg total) by mouth 2 (two) times daily. 05/15/18   Marcial Pacas, MD  traZODone (DESYREL) 50 MG tablet Take 25-50 mg by mouth at bedtime.    [provider]  vortioxetine HBr (TRINTELLIX) 5 MG TABS tablet Take 15 mg by mouth daily.     [provider]    Family History Family History  Problem Relation Age of Onset  . Dementia Mother   . Hypertension Mother   . Colon cancer Maternal Grandmother   . Lung cancer Paternal Grandfather   . Dementia Maternal Grandfather   . Hypertension Maternal Grandfather   . Breast cancer Paternal Grandmother     Social History Social History   Tobacco Use  . Smoking status: Never Smoker  . Smokeless tobacco: Never Used  Substance Use Topics  . Alcohol use: Yes    Comment: 1-2 drinks per month  . Drug use: Never     Allergies   Patient has no known allergies.   Review of Systems Review of Systems   Physical Exam Updated Vital Signs BP 94/75 (BP Location: Right Arm)   Pulse (!) 58   Temp 98.6 F (37 C)   Resp 16   Ht 5' 6"  (1.676 m)   Wt 52.2 kg   SpO2 97%   BMI 18.56 kg/m   Physical Exam Vitals signs and nursing note reviewed.  Constitutional:      General: She is not in acute distress.    Appearance: She is well-developed. She is not diaphoretic.  HENT:     Head: Normocephalic and atraumatic.  Cardiovascular:     Rate and Rhythm: Normal rate and regular rhythm.     Heart sounds: Normal heart sounds.  Pulmonary:     Effort: Pulmonary effort is normal.     Breath sounds: Normal breath sounds.  Abdominal:     General: There is no distension.     Palpations: Abdomen is soft.     Tenderness: There is no abdominal tenderness. There is no right CVA tenderness or left CVA tenderness.  Skin:    General: Skin is warm and dry.     Findings: No erythema or rash.  Neurological:     Mental Status: She is alert and oriented to person, place, and time.   Psychiatric:        Behavior: Behavior normal.      ED Treatments / Results  Labs (all labs ordered are listed, but only abnormal results are displayed) Labs Reviewed  CBC WITH DIFFERENTIAL/PLATELET - Abnormal; Notable for the following components:      Result Value   WBC 3.6 (*)    Lymphs Abs 0.6 (*)    All other components within normal limits  COMPREHENSIVE METABOLIC PANEL - Abnormal; Notable for the following components:  Total Protein 6.1 (*)    All other components within normal limits  URINALYSIS, ROUTINE W REFLEX MICROSCOPIC - Abnormal; Notable for the following components:   APPearance HAZY (*)    Leukocytes,Ua LARGE (*)    Bacteria, UA RARE (*)    All other components within normal limits  URINE CULTURE  LIPASE, BLOOD    EKG None  Radiology Ct Renal Stone Study  Result Date: 12/08/2018 CLINICAL DATA:  Right flank pain for 1 week. EXAM: CT ABDOMEN AND PELVIS WITHOUT CONTRAST TECHNIQUE: Multidetector CT imaging of the abdomen and pelvis was performed following the standard protocol without IV contrast. COMPARISON:  None. FINDINGS: Lower chest: The lung bases are clear of acute process. No pleural effusion or pulmonary lesions. The heart is normal in size. No pericardial effusion. The distal esophagus and aorta are unremarkable. Hepatobiliary: No focal hepatic lesions are identified without contrast. The gallbladder appears normal. No intra or extrahepatic biliary dilatation. Pancreas: Grossly normal without contrast. No obvious mass or inflammation. Spleen: Normal size.  No focal lesions. Adrenals/Urinary Tract: Adrenal glands and kidneys are unremarkable. No renal, ureteral or bladder calculi or mass. Mild under rotation of both kidneys is noted. Stomach/Bowel: The stomach, duodenum, small bowel and colon are grossly normal without oral contrast. No inflammatory changes, mass lesions or obstructive findings. The terminal ileum and appendix are normal. Vascular/Lymphatic:  The aorta is normal in caliber. No atheroscerlotic calcifications. No mesenteric of retroperitoneal mass or adenopathy. Small scattered lymph nodes are noted. Reproductive: The uterus and ovaries are unremarkable. Other: No pelvic mass or adenopathy. No free pelvic fluid collections. No inguinal mass or adenopathy. No abdominal wall hernia or subcutaneous lesions. Musculoskeletal: No significant bony findings. IMPRESSION: 1. Unremarkable noncontrast CT examination of the abdomen/pelvis. No acute abdominal/pelvic findings, mass lesions or adenopathy. 2. No renal, ureteral or bladder calculi. Electronically Signed   By: Marijo Sanes M.D.   On: 12/08/2018 11:28    Procedures Procedures (including critical care time)  Medications Ordered in ED Medications - No data to display   Initial Impression / Assessment and Plan / ED Course  I have reviewed the triage vital signs and the nursing notes.  Pertinent labs & imaging results that were available during my care of the patient were reviewed by me and considered in my medical decision making (see chart for details).  Clinical Course as of Dec 07 1252  Fri Jul 03, 362  [id number] 48 year old female presents to the ER with complaint of ongoing right lower quadrant and right flank pain.  Patient was seen in the emergency room for this earlier this week, diagnosed with pyelonephritis and given antibiotics.  Patient's urine culture grew out E. coli which was pansensitive.  Patient followed up with her PCP 2 days ago for ongoing symptoms and was switched to [facility] which she is taking without improvement in her symptoms.  Patient reports nausea without vomiting.  Denies changes in bowel or bladder habits, denies vaginal discharge or risk for STIs.  On exam patient does not have any CVA tenderness, no abdominal tenderness.  Due to ongoing pain complaint with concern for pyelonephritis versus kidney stone, CT noncontrast abdomen pelvis was completed and is not show  source for patient's pain today, appendix is visualized and is reported to be normal, no changes in reproductive organs.  CBC and CMP are without significant changes, lipase within normal limits, urinalysis with large leukocytes, 6-10 red blood cells and rare bacteria.  Urine was sent off for repeat culture.  Patient plans to take her Phenergan for her nausea and follow-up with her PCP, return to ER for worsening or concerning symptoms.   [LM]    Clinical Course User Index [LM] Tacy Learn, PA-C      Final Clinical Impressions(s) / ED Diagnoses   Final diagnoses:  Nausea  Right lower quadrant abdominal pain    ED Discharge Orders    None       Tacy Learn, PA-C 12/08/18 [place], [place], DO 12/08/18 1430

## 2018-12-08 NOTE — Discharge Instructions (Addendum)
Finish taking Macrobid as prescribed by your provider.  Follow-up for your culture results in the next 2 to 3 days. Follow-up with your primary care provider as discussed, return to ER for worsening or concerning symptoms.

## 2018-12-10 LAB — URINE CULTURE: Culture: NO GROWTH

## 2018-12-12 DIAGNOSIS — E890 Postprocedural hypothyroidism: Secondary | ICD-10-CM | POA: Diagnosis not present

## 2018-12-12 DIAGNOSIS — G4700 Insomnia, unspecified: Secondary | ICD-10-CM | POA: Diagnosis not present

## 2018-12-12 DIAGNOSIS — R5382 Chronic fatigue, unspecified: Secondary | ICD-10-CM | POA: Diagnosis not present

## 2018-12-12 DIAGNOSIS — Z8639 Personal history of other endocrine, nutritional and metabolic disease: Secondary | ICD-10-CM | POA: Diagnosis not present

## 2018-12-26 DIAGNOSIS — F331 Major depressive disorder, recurrent, moderate: Secondary | ICD-10-CM | POA: Diagnosis not present

## 2018-12-27 ENCOUNTER — Ambulatory Visit: Payer: BLUE CROSS/BLUE SHIELD | Admitting: Neurology

## 2018-12-27 DIAGNOSIS — R3915 Urgency of urination: Secondary | ICD-10-CM | POA: Diagnosis not present

## 2018-12-27 DIAGNOSIS — R350 Frequency of micturition: Secondary | ICD-10-CM | POA: Diagnosis not present

## 2018-12-27 DIAGNOSIS — R300 Dysuria: Secondary | ICD-10-CM | POA: Diagnosis not present

## 2019-01-03 DIAGNOSIS — N390 Urinary tract infection, site not specified: Secondary | ICD-10-CM | POA: Diagnosis not present

## 2019-01-04 ENCOUNTER — Ambulatory Visit (INDEPENDENT_AMBULATORY_CARE_PROVIDER_SITE_OTHER): Payer: BC Managed Care – PPO | Admitting: Neurology

## 2019-01-04 ENCOUNTER — Encounter: Payer: Self-pay | Admitting: Neurology

## 2019-01-04 ENCOUNTER — Other Ambulatory Visit: Payer: Self-pay

## 2019-01-04 VITALS — BP 98/69 | HR 66 | Temp 98.6°F | Ht 66.0 in | Wt 120.5 lb

## 2019-01-04 DIAGNOSIS — G43709 Chronic migraine without aura, not intractable, without status migrainosus: Secondary | ICD-10-CM

## 2019-01-04 DIAGNOSIS — IMO0002 Reserved for concepts with insufficient information to code with codable children: Secondary | ICD-10-CM

## 2019-01-04 MED ORDER — RIZATRIPTAN BENZOATE 10 MG PO TBDP: 10.0000 mg | ORAL_TABLET | ORAL | 11 refills | Status: DC | PRN

## 2019-01-04 NOTE — Progress Notes (Signed)
PATIENT: Rose Austin DOB: 1970-08-18  Chief Complaint  Patient presents with  . Migraine    States migraines have improved with Topamax and Ajovy.  She estimates 4-6 migraine days per month.  This is down from having 12 migraine days per month.  Also, she has noticed the severity has lessened.  She usually starts medicating with ibuprofen then takes rizatriptan if the pain does not resolve.      HISTORICAL  Rose Austin is a 48 year old female, seen in request by her primary care PA Marda Stalker, for evaluation of migraine headaches, initial evaluation was on May 12, 2018.  I have reviewed and summarized the referring note from the referring physician.  She had a past medical history of hypothyroidism, on supplement.  She had a history of migraine headaches since her 54s, trigger for migraines are stress, sleep deprivation, weather changes, around 2017, she was getting frequent migraine headaches, her typical migraine are left-sided severe pounding headache with associated light noise sensitivity, nauseous, she was seen by neurologist, giving preventive medications Topamax 50 mg twice a day since 2018, also use Cefaly device, which has helpful,  For a while she was taking frequent over-the-counter medications ibuprofen, Excedrin Migraine, 20 migraine headache days in 1 month, she brought a migraine journal, she is now having 7-12 migraine days each month, takes Zomig 3-4 times each month as needed, but still taking frequent Excedrin Migraine, ibuprofen,  She has never tried Maxalt in the past, limited response to Imitrex.  UPDATE January 04 2019: She now has migraine 4-6 /month, add on Ajovy has made big difference, previously she was having 18-20 migraine days each month, her migraine is better controlled with Maxalt,   REVIEW OF SYSTEMS: Full 14 system review of systems performed and notable only for headache, depression, anxiety, diarrhea, allergies, All other review  of systems were negative.  ALLERGIES: No Known Allergies  HOME MEDICATIONS: Current Outpatient Medications  Medication Sig Dispense Refill  . buPROPion (WELLBUTRIN SR) 150 MG 12 hr tablet Take 150 mg by mouth daily.     . cetirizine (ZYRTEC) 10 MG tablet Take 10 mg by mouth daily.    . Ciclopirox 1 % shampoo Apply 1 application topically at bedtime.     . clobetasol (TEMOVATE) 0.05 % external solution Apply 1 application topically 2 (two) times daily.    . clonazePAM (KLONOPIN) 0.5 MG tablet Take 0.25-0.75 mg by mouth See admin instructions. Take 0.9m qam, 0.28mat noon    . Coenzyme Q10 (COQ10) 100 MG CAPS Take 100 mg by mouth 2 (two) times daily.    . fluocinonide ointment (LIDEX) 0.6.14 Apply 1 application topically 2 (two) times daily as needed (Dermatitis). Hands    . Fremanezumab-vfrm (AJOVY) 225 MG/1.5ML SOSY Inject 225 mg into the skin every 30 (thirty) days. 1 Syringe 11  . ibuprofen (ADVIL) 200 MG tablet Take 200 mg by mouth as needed.    . [place] Kitchen-Methylfolate (DEPLIN PO) Take 1 tablet by mouth at bedtime.     . [place] Kitchenevothyroxine (SYNTHROID, LEVOTHROID) 137 MCG tablet Take 137 mcg by mouth See admin instructions. All days  EXCEPT: Sunday takes 0.5 tablet    . Multiple Vitamin (MULTIVITAMIN) tablet Take 1 tablet by mouth at bedtime.     . Nerve Stimulator (CEFALY KIT) DEVI by Does not apply route daily.     . promethazine (PHENERGAN) 25 MG tablet Take 1 tablet (25 mg total) by mouth every 6 (six) hours as needed for nausea or  vomiting. 30 tablet 6  . rizatriptan (MAXALT-MLT) 10 MG disintegrating tablet Take 1 tablet (10 mg total) by mouth as needed. May repeat in 2 hours if needed (Patient taking differently: Take 10 mg by mouth as needed for migraine. May repeat in 2 hours if needed) 15 tablet 6  . Sod Fluoride-Potassium Nitrate (PREVIDENT 5000 ENAMEL PROTECT) 1.1-5 % PSTE Place 1 application onto teeth at bedtime.     Marland Kitchen tiZANidine (ZANAFLEX) 4 MG tablet Take 1 tablet (4 mg total) by  mouth every 8 (eight) hours as needed for muscle spasms. 20 tablet 5  . topiramate (TOPAMAX) 50 MG tablet Take 1 tablet (50 mg total) by mouth 2 (two) times daily. 180 tablet 3  . traZODone (DESYREL) 50 MG tablet Take 25-50 mg by mouth at bedtime.    . TRINTELLIX 20 MG TABS tablet Take 20 mg by mouth daily.     No current facility-administered medications for this visit.     PAST MEDICAL HISTORY: Past Medical History:  Diagnosis Date  . Anterior pituitary hyperfunction ([place])   . Benign neoplasm of skin   . Focal dystonia   . Gait disorder   . Genetic torsion dystonia   . Intractable chronic migraine without aura and without status migrainosus   . Iodine hypothyroidism   . Irregular menses   . Other neutropenia ([place])   . Seborrheic dermatitis   . Thrombosed external hemorrhoid     PAST SURGICAL HISTORY: Past Surgical History:  Procedure Laterality Date  . HYMENECTOMY      FAMILY HISTORY: Family History  Problem Relation Age of Onset  . Dementia Mother   . Hypertension Mother   . Colon cancer Maternal Grandmother   . Lung cancer Paternal Grandfather   . Dementia Maternal Grandfather   . Hypertension Maternal Grandfather   . Breast cancer Paternal Grandmother     SOCIAL HISTORY: Social History   Socioeconomic History  . Marital status: Divorced    Spouse name: Not on file  . Number of children: 0  . Years of education: college  . Highest education level: Master's degree (e.g., MA, MS, MEng, MEd, MSW, MBA)  Occupational History  . 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 Financial resource strain: Not on file  . Food insecurity    Worry: Not on file    Inability: Not on file  . Transportation needs    Medical: Not on file    Non-medical: Not on file  Tobacco Use  . Smoking status: Never Smoker  . Smokeless tobacco: Never Used  Substance and Sexual Activity  . Alcohol use: Yes    Comment: 1-2 drinks per month  . Drug use: Never  . Sexual activity: Not  on file  Lifestyle  . Physical activity    Days per week: Not on file    Minutes per session: Not on file  . Stress: Not on file  Relationships  . Social Herbalist on phone: Not on file    Gets together: Not on file    Attends religious service: Not on file    Active member of club or organization: Not on file    Attends meetings of clubs or organizations: Not on file    Relationship status: Not on file  . Intimate partner violence    Fear of current or ex partner: Not on file    Emotionally abused: Not on file    Physically abused: Not on file  Forced sexual activity: Not on file  Other Topics Concern  . Not on file  Social History Narrative   Lives at home alone.   Right-handed.   1 cup caffeine per day.     PHYSICAL EXAM   Vitals:   01/04/19 1512  BP: 98/69  Pulse: 66  Temp: 98.6 F (37 C)  Weight: 120 lb 8 oz (54.7 kg)  Height: _0  (1.676 m)    Not recorded      Body mass index is 19.45 kg/m.  PHYSICAL EXAMNIATION:  Gen: NAD, conversant, well nourised, obese, well groomed                     Cardiovascular: Regular rate rhythm, no peripheral edema, warm, nontender. Eyes: Conjunctivae clear without exudates or hemorrhage Neck: Supple, no carotid bruits. Pulmonary: Clear to auscultation bilaterally   NEUROLOGICAL EXAM:  MENTAL STATUS: Speech:    Speech is normal; fluent and spontaneous with normal comprehension.  Cognition:     Orientation to time, place and person     Normal recent and remote memory     Normal Attention span and concentration     Normal Language, naming, repeating,spontaneous speech     Fund of knowledge   CRANIAL NERVES: CN II: Visual fields are full to confrontation.  Pupils are round equal and briskly reactive to light. CN III, IV, VI: extraocular movement are normal. No ptosis. CN V: Facial sensation is intact to pinprick in all 3 divisions bilaterally. Corneal responses are intact.  CN VII: Face is symmetric  with normal eye closure and smile. CN VIII: Hearing is normal to rubbing fingers CN IX, X: Palate elevates symmetrically. Phonation is normal. CN XI: Head turning and shoulder shrug are intact CN XII: Tongue is midline with normal movements and no atrophy.  MOTOR: There is no pronator drift of out-stretched arms. Muscle bulk and tone are normal. Muscle strength is normal.  REFLEXES: Reflexes are 2+ and symmetric at the biceps, triceps, knees, and ankles. Plantar responses are flexor.  SENSORY: Intact to light touch, pinprick, positional sensation and vibratory sensation are intact in fingers and toes.  COORDINATION: Rapid alternating movements and fine finger movements are intact. There is no dysmetria on finger-to-nose and heel-knee-shin.    GAIT/STANCE: Posture is normal. Gait is steady with normal steps, base, arm swing, and turning. Heel and toe walking are normal. Tandem gait is normal.  Romberg is absent.   DIAGNOSTIC DATA (LABS, IMAGING, TESTING) - I reviewed patient records, labs, notes, testing and imaging myself where available.   ASSESSMENT AND PLAN  Rose Austin is a 48 y.o. female   Chronic migraine headaches Depression anxiety  Continue current preventive medication Topamax 50 mg twice a day  Ajovy 220 mg SQ as preventive medications  Maxalt as needed. May combine it with aleve, tizanidine, phenergan as needed.  RTC with NP in 6 months    Marcial Pacas, M.D. Ph.D.  [facility] [address], [place], [place] [address] Ph: [phone] Fax: [phone]  CC:  Marda Stalker, PA-C

## 2019-01-05 DIAGNOSIS — G249 Dystonia, unspecified: Secondary | ICD-10-CM | POA: Diagnosis not present

## 2019-01-05 DIAGNOSIS — G248 Other dystonia: Secondary | ICD-10-CM | POA: Diagnosis not present

## 2019-01-09 DIAGNOSIS — F411 Generalized anxiety disorder: Secondary | ICD-10-CM | POA: Diagnosis not present

## 2019-01-11 ENCOUNTER — Other Ambulatory Visit: Payer: Self-pay | Admitting: Hematology

## 2019-01-11 ENCOUNTER — Encounter: Payer: Self-pay | Admitting: Hematology

## 2019-01-11 DIAGNOSIS — R768 Other specified abnormal immunological findings in serum: Secondary | ICD-10-CM

## 2019-01-15 ENCOUNTER — Telehealth: Payer: Self-pay | Admitting: *Deleted

## 2019-01-15 NOTE — Telephone Encounter (Signed)
Referral faxed to [facility] - Release [phone]

## 2019-01-23 ENCOUNTER — Ambulatory Visit: Payer: BC Managed Care – PPO | Admitting: Allergy & Immunology

## 2019-01-23 DIAGNOSIS — F332 Major depressive disorder, recurrent severe without psychotic features: Secondary | ICD-10-CM | POA: Diagnosis not present

## 2019-01-23 DIAGNOSIS — F411 Generalized anxiety disorder: Secondary | ICD-10-CM | POA: Diagnosis not present

## 2019-01-26 ENCOUNTER — Other Ambulatory Visit: Payer: Self-pay | Admitting: Family Medicine

## 2019-01-26 DIAGNOSIS — Z1231 Encounter for screening mammogram for malignant neoplasm of breast: Secondary | ICD-10-CM

## 2019-02-01 ENCOUNTER — Ambulatory Visit (INDEPENDENT_AMBULATORY_CARE_PROVIDER_SITE_OTHER): Payer: BC Managed Care – PPO | Admitting: Allergy & Immunology

## 2019-02-01 ENCOUNTER — Other Ambulatory Visit: Payer: Self-pay

## 2019-02-01 ENCOUNTER — Encounter: Payer: Self-pay | Admitting: Allergy & Immunology

## 2019-02-01 VITALS — BP 100/60 | HR 83 | Temp 98.1°F | Resp 16 | Ht 66.0 in | Wt 121.4 lb

## 2019-02-01 DIAGNOSIS — N390 Urinary tract infection, site not specified: Secondary | ICD-10-CM | POA: Diagnosis not present

## 2019-02-01 DIAGNOSIS — D801 Nonfamilial hypogammaglobulinemia: Secondary | ICD-10-CM | POA: Diagnosis not present

## 2019-02-01 NOTE — Patient Instructions (Addendum)
1. Hypogammaglobulinemia - I do not think that you have CVID (your IgG was on the low-ish end of normal and your IgA was nearly normal). - You also do not fit a clinical picture of CVID (recurrent sinopulmonary infections, abscesses, etc). - Typically UTIs are not indicative of an immunodeficiency. - However, we are going to get some labs to do a more thorough workup of your immune system. - We will MyChart you in 1-2 weeks with the results of the testing (likely two weeks with some of the labs we are doing). - It definitely looks like you had a past hepatitis B infection, but the PCR test will definitively prove that (we need to make sure that the virus is not circulating in your system).  - We will get the following labs:  - Alternative Pathway (AH50)  - Complement, total  - Diphtheria / Tetanus Antibody Panel  - IgG 1, 2, 3, and 4  - Lymph Enumeration, Basic & NK Cells  - B Cell Subset Analysis  - Strep pneumoniae 23 Serotypes IgG  - T-helper cells CD4/CD8 %  - Allergens Zone 2  - Hepatitis B DNA, ultraquantitative, PCR  - Hepatitis c vrs RNA detect by PCR-qual  - Vitamin B12  - Vitamin B6  - Vitamin D 1,25 dihydroxy  - Vitamin D, 25-OH,Total,IA(Refl)  - Uric acid  - Lactate dehydrogenase  - Reticulocytes  - IgG, IgA, IgM - We will make a determination of further workup once we have these results back.  2. Follow up to be determined based on the labs.   Please inform us of any Emergency Department visits, hospitalizations, or changes in symptoms. Call us before going to the ED for breathing or allergy symptoms since we might be able to fit you in for a sick visit. Feel free to contact us anytime with any questions, problems, or concerns.  It was a pleasure to meet you today!  Websites that have reliable patient information: 1. American Academy of Asthma, Allergy, and Immunology: www.aaaai.org 2. Food Allergy Research and Education (FARE): foodallergy.org 3. Mothers of  Asthmatics: http://www.asthmacommunitynetwork.org 4. American College of Allergy, Asthma, and Immunology: www.acaai.org  "Like" [place] on Facebook and Instagram for our latest updates!      Make sure you are registered to vote! If you have moved or changed any of your contact information, you will need to get this updated before voting!  In some cases, you MAY be able to register to vote online: [email or web address]    Voter ID laws are NOT going into effect for the General Election in November 2020! DO NOT let this stop you from exercising your right to vote!   Absentee voting is the SAFEST way to vote during the coronavirus pandemic!   Download and print an absentee ballot request form at [email or web address] or you can scan the QR code below with your smart phone:      More information on absentee ballots can be found here: [email or web address]

## 2019-02-01 NOTE — Progress Notes (Signed)
NEW PATIENT  Date of Service/Encounter:  02/01/19  Referring provider: Marda Stalker, PA-C   Assessment:   Hypogammaglobulinemia  Recurrent UTIs  Resolved hepatitis B infection - with positive anti-hep B core antibody   [name] presents for an immune evaluation. Although she tentatively was diagnosed with CVID when she lived in [place], I am not convinced that this is the case. Her infectious history is largely unremarkable. She does have some UTIs, but these are rarely if ever associated with immunodeficency (at least in the absence of other infections). Her IgG is on the low end of normal, but it is not low enough in my experience to diagnose her with an immunodeficiency. The IgA is flagged as low, but again typically this is much lower and oftentimes absent completely. We are going to get a more extensive workup today, including some vitamin studies, to make sure that her immune system is intact. We will determine next steps, if any, based on the follow up labs. She is very concerned with an active hepatitis infection, therefore we will get PCR tests to reassure her. I think that her labs are consistent with a past resolved infection at this time.   Plan/Recommendations:   1. Hypogammaglobulinemia - I do not think that you have CVID (your IgG was on the low-ish end of normal and your IgA was nearly normal). - You also do not fit a clinical picture of CVID (recurrent sinopulmonary infections, abscesses, etc). - Typically UTIs are not indicative of an immunodeficiency. - However, we are going to get some labs to do a more thorough workup of your immune system. - We will MyChart you in 1-2 weeks with the results of the testing (likely two weeks with some of the labs we are doing). - It definitely looks like you had a past hepatitis B infection, but the PCR test will definitively prove that (we need to make sure that the virus is not circulating in your system).  - We will get the  following labs:  - Alternative Pathway (AH50)  - Complement, total  - Diphtheria / Tetanus Antibody Panel  - IgG 1, 2, 3, and 4  - Lymph Enumeration, Basic & NK Cells  - B Cell Subset Analysis  - Strep pneumoniae 23 Serotypes IgG  - T-helper cells CD4/CD8 %  - Allergens Zone 2  - Hepatitis B DNA, ultraquantitative, PCR  - Hepatitis c vrs RNA detect by PCR-qual  - Vitamin B12  - Vitamin B6  - Vitamin D 1,25 dihydroxy  - Vitamin D, 25-OH,Total,IA(Refl)  - Uric acid  - Lactate dehydrogenase  - Reticulocytes  - IgG, IgA, IgM - We will make a determination of further workup once we have these results back.  2. Follow up to be determined based on the labs.   Subjective:   Rose Austin is a 48 y.o. female presenting today for evaluation of  Chief Complaint  Patient presents with   Immunodeficiency    Rose Austin has a history of the following: Patient Active Problem List   Diagnosis Date Noted   Low immunoglobulin level 05/23/2018   Chronic migraine 05/12/2018    History obtained from: chart review and patient.  Rose Austin was referred by Rose Stalker, PA-C.     Rose is a 48 y.o. female presenting for an evaluation of possible CVID. This diagnosis seems to have come about when she had an immunoglobulin panel in December 2019 that demonstrated an IgG of 554,  IgA of 44, and an IgM of 203. There was quite a story that led to this extensive workup.  She previously lived in [place] until May 2019. In the summer of 2018, she had extensive weight loss which occurred in the midst of a painful divorce. During that time, she did have some gastrointestinal complaints and apparently had quite the workup which was unrevealing largely. She was worked up with malabsorption, malignancy, and autoimmune disorders. However, as part of the workup, she was tested for Celiac disease. She was negative for this (including the confirmatory HLA testing), but it was noted  that her IgG and IgA were low. I do not have the first set of immunoglobulins that were sent. It seems that she did get Pneumovax in April 2019 before she moved to [place].   She was worked up for hepatitis infections somewhere during this time and she came back positive for hepatitis B core antibody and surface antibody. It seems that this workup was initially prompted due to her weight loss as well as a workup for STDs (her husband was unfaithful to her and she wanted to make sure that she had not contracted anything.   Once she moved to [place], she moved in with her elderly parents to help care for them. Then she started to establish care with various specialists around [place]. She first went to see Dr. Tish Men with Hematology/Oncology, who did not feel that immunoglobulin replacement was warranted and recommended seeing Immunology for further workup.   Regarding her infectious history, she does not tend to get infections at all really. She does not remember the last time that she needed antibiotics at all aside from some UTIs over the summer (cultures grew E coli per the patient). She does not remember ever having needed any IV antibiotics and she has never had any deep-seated infections. Her GI symptoms have resolved and she has gained about 10-15 pounds back from her lowest weight.   She does have a history of Graves disease and was treated with radioactive iodine in  2000. She is on Synthroid and sees an Musician on Rose Austin), although she cannot remember exactly who she sees.   She does not really have any allergic rhinitis symptoms whatsoever. She does not have any food allergies to her knowledge. 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   Otherwise, there is no history of other atopic diseases, including asthma, food allergies, drug allergies, environmental allergies, stinging insect  allergies, eczema, urticaria or contact dermatitis. There is no significant infectious history. Vaccinations are up to date.    Past Medical History: Patient Active Problem List   Diagnosis Date Noted   Low immunoglobulin level 05/23/2018   Chronic migraine 05/12/2018    Medication List:  Allergies as of 02/01/2019   No Known Allergies     Medication List       Accurate as of February 01, 2019 11:59 PM. If you have any questions, ask your nurse or doctor.        buPROPion 150 MG 12 hr tablet Commonly known as: WELLBUTRIN SR Take 150 mg by mouth 2 (two) times daily.   Cefaly Kit Devi by Does not apply route daily.   cetirizine 10 MG tablet Commonly known as: ZYRTEC Take 10 mg by mouth daily.   Ciclopirox 1 % shampoo Apply 1 application topically at bedtime.   clobetasol 0.05 % external solution Commonly known as:  TEMOVATE Apply 1 application topically 2 (two) times daily.   clonazePAM 0.5 MG tablet Commonly known as: KLONOPIN Take 0.25-0.75 mg by mouth See admin instructions. Take 0.39m qam, 0.279mat noon   CoQ10 100 MG Caps Take 100 mg by mouth 2 (two) times daily.   DEPLIN PO Take 1 tablet by mouth at bedtime.   fluocinonide ointment 0.05 % Commonly known as: LIDEX Apply 1 application topically 2 (two) times daily as needed (Dermatitis). Hands   Fremanezumab-vfrm 225 MG/1.5ML Sosy Commonly known as: Ajovy Inject 225 mg into the skin every 30 (thirty) days.   ibuprofen 200 MG tablet Commonly known as: ADVIL Take 200 mg by mouth as needed.   levothyroxine 137 MCG tablet Commonly known as: SYNTHROID Take 137 mcg by mouth See admin instructions. All days  EXCEPT: Sunday takes 0.5 tablet   multivitamin tablet Take 1 tablet by mouth at bedtime.   PreviDent 5000 Enamel Protect 1.1-5 % Pste Generic drug: Sod Fluoride-Potassium Nitrate Place 1 application onto teeth at bedtime.   PROBIOTIC PO Take 1 capsule by mouth daily.   promethazine 25 MG  tablet Commonly known as: PHENERGAN Take 1 tablet (25 mg total) by mouth every 6 (six) hours as needed for nausea or vomiting.   rizatriptan 10 MG disintegrating tablet Commonly known as: Maxalt-MLT Take 1 tablet (10 mg total) by mouth as needed for migraine. May repeat in 2 hours if needed   tiZANidine 4 MG tablet Commonly known as: Zanaflex Take 1 tablet (4 mg total) by mouth every 8 (eight) hours as needed for muscle spasms.   topiramate 50 MG tablet Commonly known as: TOPAMAX Take 1 tablet (50 mg total) by mouth 2 (two) times daily.   traZODone 50 MG tablet Commonly known as: DESYREL Take 25-50 mg by mouth at bedtime.   Trintellix 10 MG Tabs tablet Generic drug: vortioxetine HBr Trintellix 10 mg tablet What changed: Another medication with the same name was removed. Continue taking this medication, and follow the directions you see here. Changed by: JoValentina ShaggyMD   Trintellix 5 MG Tabs tablet Generic drug: vortioxetine HBr Trintellix 5 mg tablet What changed: Another medication with the same name was removed. Continue taking this medication, and follow the directions you see here. Changed by: JoValentina ShaggyMD       Birth History: non-contributory  Developmental History: non-contributory  Past Surgical History: Past Surgical History:  Procedure Laterality Date   HYMENECTOMY       Family History: Family History  Problem Relation Age of Onset   Dementia Mother    Hypertension Mother    Colon cancer Maternal Grandmother    Lung cancer Paternal Grandfather    Dementia Maternal Grandfather    Hypertension Maternal Grandfather    Breast cancer Paternal Grandmother      Social History: [place] at home with her parents. She lives in an apartment that is around one year old. There is wood throughout the home and carpeting in the bedroom. She has electric heating and central cooling. There are no animals inside or outside of the  home. There are no dust mite coverings on her bedding and no tobacco exposure in the home.     Review of Systems  Constitutional: Negative.  Negative for fever, malaise/fatigue and weight loss.  HENT: Negative.  Negative for congestion, ear discharge, ear pain, sinus pain and sore throat.   Eyes: Negative for pain, discharge and redness.  Respiratory: Negative for cough, sputum production,  shortness of breath and wheezing.   Cardiovascular: Negative.  Negative for chest pain and palpitations.  Gastrointestinal: Negative for abdominal pain, heartburn, nausea and vomiting.  Skin: Negative.  Negative for itching and rash.  Neurological: Negative for dizziness and headaches.  Endo/Heme/Allergies: Negative for environmental allergies. Does not bruise/bleed easily.       Objective:   Blood pressure 100/60, pulse 83, temperature 98.1 F (36.7 C), temperature source Temporal, resp. rate 16, height _0  (1.676 m), weight 121 lb 6.4 oz (55.1 kg), SpO2 97 %. Body mass index is 19.59 kg/m.   Physical Exam:   Physical Exam  Constitutional: She appears well-developed.  Thin appearing female.   HENT:  Head: Normocephalic and atraumatic.  Right Ear: Tympanic membrane, external ear and ear canal normal. No drainage, swelling or tenderness. Tympanic membrane is not injected, not scarred, not erythematous, not retracted and not bulging.  Left Ear: Tympanic membrane, external ear and ear canal normal. No drainage, swelling or tenderness. Tympanic membrane is not injected, not scarred, not erythematous, not retracted and not bulging.  Nose: Mucosal edema present. No rhinorrhea, nasal deformity or septal deviation. No epistaxis. Right sinus exhibits no maxillary sinus tenderness and no frontal sinus tenderness. Left sinus exhibits no maxillary sinus tenderness and no frontal sinus tenderness.  Mouth/Throat: Uvula is midline and oropharynx is clear and moist. Mucous membranes are not pale and not dry.    Eyes: Pupils are equal, round, and reactive to light. Conjunctivae and EOM are normal. Right eye exhibits no chemosis and no discharge. Left eye exhibits no chemosis and no discharge. Right conjunctiva is not injected. Left conjunctiva is not injected.  Cardiovascular: Normal rate, regular rhythm and normal heart sounds.  Respiratory: Effort normal and breath sounds normal. No accessory muscle usage. No tachypnea. No respiratory distress. She has no wheezes. She has no rhonchi. She has no rales. She exhibits no tenderness.  Moving air well in all lung fields. There is no increased work of breathing noted.  GI: There is no abdominal tenderness. There is no rebound and no guarding.  Lymphadenopathy:       Head (right side): No submandibular, no tonsillar, no preauricular and no occipital adenopathy present.       Head (left side): No submandibular, no tonsillar, no preauricular and no occipital adenopathy present.    She has no cervical adenopathy.  Neurological: She is alert.  Skin: No abrasion, no petechiae and no rash noted. Rash is not papular, not vesicular and not urticarial. No erythema. No pallor.  There are no rashes appreciated.   Psychiatric: She has a normal mood and affect.     Diagnostic studies: labs sent instead      Salvatore Marvel, MD [facility]

## 2019-02-02 DIAGNOSIS — M79644 Pain in right finger(s): Secondary | ICD-10-CM | POA: Diagnosis not present

## 2019-02-02 DIAGNOSIS — M13842 Other specified arthritis, left hand: Secondary | ICD-10-CM | POA: Diagnosis not present

## 2019-02-02 DIAGNOSIS — M79645 Pain in left finger(s): Secondary | ICD-10-CM | POA: Diagnosis not present

## 2019-02-02 DIAGNOSIS — M13841 Other specified arthritis, right hand: Secondary | ICD-10-CM | POA: Diagnosis not present

## 2019-02-02 LAB — VITAMIN D 25 HYDROXY (VIT D DEFICIENCY, FRACTURES): Vit D, 25-Hydroxy: 35.5 ng/mL (ref 30.0–100.0)

## 2019-02-03 ENCOUNTER — Encounter: Payer: Self-pay | Admitting: Allergy & Immunology

## 2019-02-03 DIAGNOSIS — M19041 Primary osteoarthritis, right hand: Secondary | ICD-10-CM | POA: Insufficient documentation

## 2019-02-03 DIAGNOSIS — S60559A Superficial foreign body of unspecified hand, initial encounter: Secondary | ICD-10-CM | POA: Insufficient documentation

## 2019-02-07 DIAGNOSIS — F411 Generalized anxiety disorder: Secondary | ICD-10-CM | POA: Diagnosis not present

## 2019-02-07 DIAGNOSIS — F332 Major depressive disorder, recurrent severe without psychotic features: Secondary | ICD-10-CM | POA: Diagnosis not present

## 2019-02-20 DIAGNOSIS — F411 Generalized anxiety disorder: Secondary | ICD-10-CM | POA: Diagnosis not present

## 2019-02-21 LAB — IGE+ALLERGENS ZONE 2(30)
Alternaria Alternata IgE: <0.1 kU/L
Amer Sycamore IgE Qn: <0.1 kU/L
Aspergillus Fumigatus IgE: <0.1 kU/L
Bahia Grass IgE: <0.1 kU/L
Bermuda Grass IgE: <0.1 kU/L
Cat Dander IgE: <0.1 kU/L
Cedar, Mountain IgE: <0.1 kU/L
Cladosporium Herbarum IgE: <0.1 kU/L
Cockroach, American IgE: <0.1 kU/L
Common Silver Birch IgE: <0.1 kU/L
D Farinae IgE: <0.1 kU/L
D Pteronyssinus IgE: <0.1 kU/L
Dog Dander IgE: <0.1 kU/L
Elm, American IgE: <0.1 kU/L
Hickory, White IgE: <0.1 kU/L
IgE (Immunoglobulin E), Serum: <2 IU/mL — ABNORMAL LOW (ref 6–495)
Johnson Grass IgE: <0.1 kU/L
Maple/Box Elder IgE: <0.1 kU/L
Mucor Racemosus IgE: <0.1 kU/L
Mugwort IgE Qn: <0.1 kU/L
Nettle IgE: <0.1 kU/L
Oak, White IgE: <0.1 kU/L
Penicillium Chrysogen IgE: <0.1 kU/L
Pigweed, Rough IgE: <0.1 kU/L
Plantain, English IgE: <0.1 kU/L
Ragweed, Short IgE: <0.1 kU/L
Sheep Sorrel IgE Qn: <0.1 kU/L
Stemphylium Herbarum IgE: <0.1 kU/L
Sweet gum IgE RAST Ql: <0.1 kU/L
Timothy Grass IgE: <0.1 kU/L
White Mulberry IgE: <0.1 kU/L

## 2019-02-21 LAB — STREP PNEUMONIAE 23 SEROTYPES IGG
Pneumo Ab Type 1*: 5.4 ug/mL (ref >1.3)
Pneumo Ab Type 12 (12F)*: 0.2 ug/mL — ABNORMAL LOW (ref >1.3)
Pneumo Ab Type 14*: >29.1 ug/mL (ref >1.3)
Pneumo Ab Type 17 (17F)*: 4.6 ug/mL (ref >1.3)
Pneumo Ab Type 19 (19F)*: 9.3 ug/mL (ref >1.3)
Pneumo Ab Type 2*: 1.1 ug/mL — ABNORMAL LOW (ref >1.3)
Pneumo Ab Type 20*: 1.3 ug/mL — ABNORMAL LOW (ref >1.3)
Pneumo Ab Type 22 (22F)*: 6.1 ug/mL (ref >1.3)
Pneumo Ab Type 23 (23F)*: 1.7 ug/mL (ref >1.3)
Pneumo Ab Type 26 (6B)*: 12.4 ug/mL (ref >1.3)
Pneumo Ab Type 3*: 4.8 ug/mL (ref >1.3)
Pneumo Ab Type 34 (10A)*: 0.4 ug/mL — ABNORMAL LOW (ref >1.3)
Pneumo Ab Type 4*: 0.3 ug/mL — ABNORMAL LOW (ref >1.3)
Pneumo Ab Type 43 (11A)*: 1 ug/mL — ABNORMAL LOW (ref >1.3)
Pneumo Ab Type 5*: 0.5 ug/mL — ABNORMAL LOW (ref >1.3)
Pneumo Ab Type 51 (7F)*: 2.6 ug/mL (ref >1.3)
Pneumo Ab Type 54 (15B)*: 1.1 ug/mL — ABNORMAL LOW (ref >1.3)
Pneumo Ab Type 56 (18C)*: 2 ug/mL (ref >1.3)
Pneumo Ab Type 57 (19A)*: 36.8 ug/mL (ref >1.3)
Pneumo Ab Type 68 (9V)*: 1.7 ug/mL (ref >1.3)
Pneumo Ab Type 70 (33F)*: 0.6 ug/mL — ABNORMAL LOW (ref >1.3)
Pneumo Ab Type 8*: 1.7 ug/mL (ref >1.3)
Pneumo Ab Type 9 (9N)*: 4.3 ug/mL (ref >1.3)

## 2019-02-21 LAB — LYMPH ENUMERATION, BASIC & NK CELLS
% CD 3 Pos. Lymph.: 70.3 % (ref 57.5–86.2)
% CD 4 Pos. Lymph.: 44.3 % (ref 30.8–58.5)
% NK (CD56/16): 19.3 % (ref 1.4–19.4)
Ab NK (CD56/16): 135 /uL (ref 24–406)
Absolute CD 3: 492 /uL — ABNORMAL LOW (ref 622–2402)
Absolute CD 4 Helper: 310 /uL — ABNORMAL LOW (ref 359–1519)
Basophils Absolute: 0 10*3/uL (ref 0.0–0.2)
Basos: 1 %
CD19 % B Cell: 8.8 % (ref 3.3–25.4)
CD19 Abs: 62 /uL (ref 12–645)
CD4/CD8 Ratio: 1.73 (ref 0.92–3.72)
CD8 % Suppressor T Cell: 25.6 % (ref 12.0–35.5)
CD8 T Cell Abs: 179 /uL (ref 109–897)
EOS (ABSOLUTE): 0.1 10*3/uL (ref 0.0–0.4)
Eos: 1 %
Hematocrit: 40.9 % (ref 34.0–46.6)
Hemoglobin: 13.6 g/dL (ref 11.1–15.9)
Immature Grans (Abs): 0 10*3/uL (ref 0.0–0.1)
Immature Granulocytes: 0 %
Lymphocytes Absolute: 0.7 10*3/uL (ref 0.7–3.1)
Lymphs: 16 %
MCH: 30.6 pg (ref 26.6–33.0)
MCHC: 33.3 g/dL (ref 31.5–35.7)
MCV: 92 fL (ref 79–97)
Monocytes Absolute: 0.5 10*3/uL (ref 0.1–0.9)
Monocytes: 10 %
Neutrophils Absolute: 3.1 10*3/uL (ref 1.4–7.0)
Neutrophils: 72 %
Platelets: 192 10*3/uL (ref 150–450)
RBC: 4.44 x10E6/uL (ref 3.77–5.28)
RDW: 12 % (ref 11.7–15.4)
WBC: 4.3 10*3/uL (ref 3.4–10.8)

## 2019-02-21 LAB — RETICULOCYTES: Retic Ct Pct: 1.2 % (ref 0.6–2.6)

## 2019-02-21 LAB — IGG 1, 2, 3, AND 4
IgG (Immunoglobin G), Serum: 687 mg/dL (ref 586–1602)
IgG, Subclass 1: 307 mg/dL (ref 248–810)
IgG, Subclass 2: 247 mg/dL (ref 130–555)
IgG, Subclass 3: 41 mg/dL (ref 15–102)
IgG, Subclass 4: 34 mg/dL (ref 2–96)

## 2019-02-21 LAB — VITAMIN D 1,25 DIHYDROXY
Vitamin D 1, 25 (OH)2 Total: 45 pg/mL
Vitamin D2 1, 25 (OH)2: <10 pg/mL
Vitamin D3 1, 25 (OH)2: 45 pg/mL

## 2019-02-21 LAB — B CELL SUBSET ANALYSIS
Activated CD21low CD38- %: 7.2 % of CD19 (ref 1.2–9.0)
Activated CD21low CD38-: 6 cells/uL (ref 3–26)
CD19+ B cells %: 9.6 % Lymphs (ref 6.4–22.0)
CD19+ B cells: 76 cells/uL — ABNORMAL LOW (ref 110–450)
CD20+ %: 99.8 % of CD19 (ref 96.0–100.0)
CD20+: 76 cells/uL — ABNORMAL LOW (ref 110–450)
Class-switched CD27+IgD-IgM- %: 7.1 % of CD19 (ref 5.1–22.0)
Class-switched CD27+IgD-IgM-: 5 cells/uL — ABNORMAL LOW (ref 11–61)
Non switched CD27+IgD+IgM+ %: 12.1 % of CD19 (ref 2.4–15.0)
Non switched CD27+IgD+IgM+: 9 cells/uL (ref 5–46)
Plasmablasts CD38+IgM- %: 0.2 % of CD19 — ABNORMAL LOW (ref 0.4–4.1)
Plasmablasts CD38+IgM-: 0 cells/uL — ABNORMAL LOW (ref 1–8)
Total Memory CD27+ %: 25.6 % of CD19 (ref 10.0–33.0)
Total Memory CD27+: 20 cells/uL — ABNORMAL LOW (ref 23–110)
Transitional CD38+IgM+ %: 1.3 % of CD19 (ref 0.7–5.9)
Transitional CD38+IgM+: 1 cells/uL (ref 1–17)

## 2019-02-21 LAB — VITAMIN B12: Vitamin B-12: 1048 pg/mL (ref 232–1245)

## 2019-02-21 LAB — HEPATITIS B DNA, ULTRAQUANTITATIVE, PCR: HBV DNA SERPL PCR-ACNC: NOT DETECTED IU/mL

## 2019-02-21 LAB — DIPHTHERIA / TETANUS ANTIBODY PANEL
Diphtheria Ab: 0.77 IU/mL (ref <0.10)
Tetanus Ab, IgG: 4.49 IU/mL (ref <0.10)

## 2019-02-21 LAB — HEPATITIS C VRS RNA DETECT BY PCR-QUAL: HCV RNA NAA Qualitative: NEGATIVE

## 2019-02-21 LAB — URIC ACID: Uric Acid: 2.2 mg/dL — ABNORMAL LOW (ref 2.5–7.1)

## 2019-02-21 LAB — LACTATE DEHYDROGENASE: LDH: 198 IU/L (ref 119–226)

## 2019-02-21 LAB — IGG, IGA, IGM
IgA/Immunoglobulin A, Serum: 44 mg/dL — ABNORMAL LOW (ref 87–352)
IgM (Immunoglobulin M), Srm: 225 mg/dL — ABNORMAL HIGH (ref 26–217)

## 2019-02-21 LAB — VITAMIN B6: Vitamin B6: 18.4 ug/L (ref 2.0–32.8)

## 2019-02-21 LAB — T-HELPER CELLS CD4/CD8 %
% CD 4 Pos. Lymph.: 46.8 % (ref 30.8–58.5)
Absolute CD 4 Helper: 328 /uL — ABNORMAL LOW (ref 359–1519)
CD3+CD4+ Cells/CD3+CD8+ Cells Bld: 1.84 (ref 0.92–3.72)
CD3+CD8+ Cells # Bld: 178 /uL (ref 109–897)
CD3+CD8+ Cells NFr Bld: 25.4 % (ref 12.0–35.5)

## 2019-02-21 LAB — COMPLEMENT, TOTAL: Compl, Total (CH50): >60 U/mL (ref >41)

## 2019-02-21 LAB — ALTERNATIVE PATHWAY (AH50): Alternative Pathway (AH50): 100 Units/mL (ref 77–159)

## 2019-02-23 ENCOUNTER — Telehealth: Payer: Self-pay

## 2019-02-23 NOTE — Telephone Encounter (Signed)
Called and left pt a message to schedule a tele-visit for today 02/23/19 to go over lab results.

## 2019-03-01 ENCOUNTER — Ambulatory Visit (INDEPENDENT_AMBULATORY_CARE_PROVIDER_SITE_OTHER): Payer: BC Managed Care – PPO | Admitting: Allergy & Immunology

## 2019-03-01 ENCOUNTER — Other Ambulatory Visit: Payer: Self-pay

## 2019-03-01 ENCOUNTER — Encounter: Payer: Self-pay | Admitting: Allergy & Immunology

## 2019-03-01 DIAGNOSIS — R768 Other specified abnormal immunological findings in serum: Secondary | ICD-10-CM

## 2019-03-01 DIAGNOSIS — D801 Nonfamilial hypogammaglobulinemia: Secondary | ICD-10-CM | POA: Diagnosis not present

## 2019-03-01 DIAGNOSIS — D729 Disorder of white blood cells, unspecified: Secondary | ICD-10-CM | POA: Diagnosis not present

## 2019-03-01 DIAGNOSIS — D72810 Lymphocytopenia: Secondary | ICD-10-CM

## 2019-03-01 DIAGNOSIS — N390 Urinary tract infection, site not specified: Secondary | ICD-10-CM

## 2019-03-01 NOTE — Progress Notes (Signed)
RE: [place] MRN: [id number] DOB: 1971/06/07 Date of Telemedicine Visit: 03/01/2019  Referring provider: Marda Stalker, PA-C Primary care provider: Marda Stalker, PA-C  Chief Complaint: Results   Telemedicine Follow Up Visit via Telephone: I connected with Rose Austin for a follow up on 03/02/19 by telephone and verified that I am speaking with the correct person using two identifiers.   I discussed the limitations, risks, security and privacy concerns of performing an evaluation and management service by telephone and the availability of in person appointments. I also discussed with the patient that there may be a patient responsible charge related to this service. The patient expressed understanding and agreed to proceed.  Patient is at home.  Provider is at the office.  Visit start time: 3:29 PM Visit end time: 4:08 PM Insurance consent/check in by: [facility] consent and medical assistant/nurse: Trina  History of Present Illness:  She is a 48 y.o. female, who is being followed for concern for CVID . Her previous allergy office visit was in August 2020 with myself. At that time, she presented for an immune evaluation. Briefly, she had previously been diagnosed with CVID when she lived in [place], but I was not convinced that she had CVID at the time that I met her. The only infections that she was experiencing was recurrent UTIs. Her IgG and IgA, while flagged as low, were more on the low end of normal, but not really low enough for me to be concerned. She was also concerned with an active hepatitis infection, stemming from her ex husband's infidelity.   We obtained quite a few labs to assess her immune system as well as vitamin levels and viral PCRs. These did come back with a mixed bag and I decided to give her a call to discuss these. Her IgG, IgA, and IgM were all normal, as were her IgG subclasses. She was protective to 14/23 serotypes of Pneumococcus, which  while not superb is probably fine. She was protective to Tetanus and Haemophilus as well as Diptheria. Her CH50 and AH50 were both normal. Hepatitis viral PCRs were all negative. Of note, she had previously had negative HIV antibody testing.   However, we did testing to look at her T cell markers and B cells markers. These were slightly off. T cell markers demonstrated a low absolute CD3 count (492 with a range of [phone]) as well as a low absolute CD4 T cell count (301 with range [phone]). His B cell panel demonstrated a low number of CD3 B cell (76 with a range of 110-450) as well as a low switched memory B cell number (20 with a range of 23-110). She had a low class-switched memory B cell number (5 with a range of 11-61) and a low plasmablast number.    We reviewed these labs and she did some Googling herself. She is concerned with lymphoma, but she denies weight loss and fevers. She has not had any lymphadenopathy whatsoever. She reports that Dr. Maylon Peppers was not overly concerned with this either.   Otherwise, there have been no changes to her past medical history, surgical history, family history, or social history.  Assessment and Plan:  Rose is a 48 y.o. female with:  Hypogammaglobulinemia - with apparent B cell memory defect  Recurrent UTIs  Resolved hepatitis B infection - with positive anti-hep B core antibody  Lymphopenia - with low T cells and B cells (unclear clinical significance)  Chronic non-allergic rhinitis   It is unclear of  the relevance of the low T cells and B cells. There is a possibility that she just happens to lie on the low end of the normal range and/or she had some viral suppression. From an immunology perspective, she is not having any infections whatsoever aside from UTIs. Her immunoglobulin levels remain normal and her titers are within normal limits. HIV and hepatitis have been ruled out with negative antibody and PCR testing. We will definitely be  re-sending these flow cytometry panels in the next 2-3 months to see where these are trending. We might have caught CVID or an immunodeficiency early in the clinical picture. Obviously we should be concerned with a neoplastic process, but reassuringly her LDH and uric acid are normal.   I am going to route these results to her oncologist, Dr. Maylon Peppers, so that he can comment on the results of the testing. Dr. Maylon Peppers might feel that a larger oncologic workup is indicated.     Diagnostics: None.  Medication List:  Current Outpatient Medications  Medication Sig Dispense Refill  . buPROPion (WELLBUTRIN SR) 150 MG 12 hr tablet Take 150 mg by mouth 2 (two) times daily.     . cetirizine (ZYRTEC) 10 MG tablet Take 10 mg by mouth daily.    . Ciclopirox 1 % shampoo Apply 1 application topically at bedtime.     . clobetasol (TEMOVATE) 0.05 % external solution Apply 1 application topically 2 (two) times daily.    . clonazePAM (KLONOPIN) 0.5 MG tablet Take 0.25-0.75 mg by mouth See admin instructions. Take 0.1m qam, 0.273mat noon    . Coenzyme Q10 (COQ10) 100 MG CAPS Take 100 mg by mouth 2 (two) times daily.    . fluocinonide ointment (LIDEX) 0.4.78 Apply 1 application topically 2 (two) times daily as needed (Dermatitis). Hands    . Fremanezumab-vfrm (AJOVY) 225 MG/1.5ML SOSY Inject 225 mg into the skin every 30 (thirty) days. 1 Syringe 11  . ibuprofen (ADVIL) 200 MG tablet Take 200 mg by mouth as needed.    . Marland Kitchen-Methylfolate (DEPLIN PO) Take 1 tablet by mouth at bedtime.     . Marland Kitchenevothyroxine (SYNTHROID, LEVOTHROID) 137 MCG tablet Take 137 mcg by mouth See admin instructions. All days  EXCEPT: Sunday takes 0.5 tablet    . Multiple Vitamin (MULTIVITAMIN) tablet Take 1 tablet by mouth at bedtime.     . Nerve Stimulator (CEFALY KIT) DEVI by Does not apply route daily.     . Probiotic Product (PROBIOTIC PO) Take 1 capsule by mouth daily.    . promethazine (PHENERGAN) 25 MG tablet Take 1 tablet (25 mg total) by  mouth every 6 (six) hours as needed for nausea or vomiting. 30 tablet 6  . rizatriptan (MAXALT-MLT) 10 MG disintegrating tablet Take 1 tablet (10 mg total) by mouth as needed for migraine. May repeat in 2 hours if needed 15 tablet 11  . Sod Fluoride-Potassium Nitrate (PREVIDENT 5000 ENAMEL PROTECT) 1.1-5 % PSTE Place 1 application onto teeth at bedtime.     . topiramate (TOPAMAX) 50 MG tablet Take 1 tablet (50 mg total) by mouth 2 (two) times daily. 180 tablet 3  . traZODone (DESYREL) 50 MG tablet Take 25-50 mg by mouth at bedtime.    . vortioxetine HBr (TRINTELLIX) 10 MG TABS tablet Trintellix 10 mg tablet    . vortioxetine HBr (TRINTELLIX) 5 MG TABS tablet Trintellix 5 mg tablet     No current facility-administered medications for this visit.    Allergies: No Known Allergies  I reviewed her past medical history, social history, family history, and environmental history and no significant changes have been reported from previous visits.  Review of Systems  Constitutional: Negative for activity change, appetite change, chills, diaphoresis and fatigue.  HENT: Negative for congestion, postnasal drip, rhinorrhea, sinus pressure, sinus pain, sneezing and sore throat.   Eyes: Negative for pain, discharge, redness and itching.  Respiratory: Negative for shortness of breath, wheezing and stridor.   Gastrointestinal: Negative for diarrhea, nausea and vomiting.  Endocrine: Negative for cold intolerance and heat intolerance.  Musculoskeletal: Negative for arthralgias, joint swelling and myalgias.  Skin: Negative for pallor, rash and wound.  Allergic/Immunologic: Negative for environmental allergies, food allergies and immunocompromised state.    Objective:  Physical exam not obtained as encounter was done via telephone.   Previous notes and tests were reviewed.  I discussed the assessment and treatment plan with the patient. The patient was provided an opportunity to ask questions and all were  answered. The patient agreed with the plan and demonstrated an understanding of the instructions.   The patient was advised to call back or seek an in-person evaluation if the symptoms worsen or if the condition fails to improve as anticipated.  I provided 39 minutes of non-face-to-face time during this encounter.  It was my pleasure to participate in Rose Austin's care today. Please feel free to contact me with any questions or concerns.   Sincerely,  Valentina Shaggy, MD

## 2019-03-02 ENCOUNTER — Encounter: Payer: Self-pay | Admitting: Allergy & Immunology

## 2019-03-02 DIAGNOSIS — D72810 Lymphocytopenia: Secondary | ICD-10-CM | POA: Insufficient documentation

## 2019-03-02 DIAGNOSIS — N390 Urinary tract infection, site not specified: Secondary | ICD-10-CM | POA: Insufficient documentation

## 2019-03-02 DIAGNOSIS — D72818 Other decreased white blood cell count: Secondary | ICD-10-CM | POA: Insufficient documentation

## 2019-03-02 DIAGNOSIS — D729 Disorder of white blood cells, unspecified: Secondary | ICD-10-CM | POA: Insufficient documentation

## 2019-03-02 DIAGNOSIS — R768 Other specified abnormal immunological findings in serum: Secondary | ICD-10-CM | POA: Insufficient documentation

## 2019-03-03 ENCOUNTER — Other Ambulatory Visit: Payer: Self-pay | Admitting: Hematology

## 2019-03-08 DIAGNOSIS — E890 Postprocedural hypothyroidism: Secondary | ICD-10-CM | POA: Diagnosis not present

## 2019-03-12 ENCOUNTER — Encounter: Payer: Self-pay | Admitting: Allergy & Immunology

## 2019-03-15 ENCOUNTER — Ambulatory Visit
Admission: RE | Admit: 2019-03-15 | Discharge: 2019-03-15 | Disposition: A | Discharge status: Home / Self Care | Payer: BC Managed Care – PPO | Source: Ambulatory Visit | Attending: Family Medicine | Admitting: Family Medicine

## 2019-03-15 ENCOUNTER — Other Ambulatory Visit: Payer: Self-pay

## 2019-03-15 DIAGNOSIS — Z1231 Encounter for screening mammogram for malignant neoplasm of breast: Secondary | ICD-10-CM | POA: Diagnosis not present

## 2019-03-16 DIAGNOSIS — F331 Major depressive disorder, recurrent, moderate: Secondary | ICD-10-CM | POA: Diagnosis not present

## 2019-03-20 DIAGNOSIS — F411 Generalized anxiety disorder: Secondary | ICD-10-CM | POA: Diagnosis not present

## 2019-03-21 DIAGNOSIS — R5382 Chronic fatigue, unspecified: Secondary | ICD-10-CM | POA: Diagnosis not present

## 2019-03-21 DIAGNOSIS — E890 Postprocedural hypothyroidism: Secondary | ICD-10-CM | POA: Diagnosis not present

## 2019-03-21 DIAGNOSIS — Z8639 Personal history of other endocrine, nutritional and metabolic disease: Secondary | ICD-10-CM | POA: Diagnosis not present

## 2019-03-21 DIAGNOSIS — G4700 Insomnia, unspecified: Secondary | ICD-10-CM | POA: Diagnosis not present

## 2019-03-23 DIAGNOSIS — Z23 Encounter for immunization: Secondary | ICD-10-CM | POA: Diagnosis not present

## 2019-03-30 DIAGNOSIS — G249 Dystonia, unspecified: Secondary | ICD-10-CM | POA: Diagnosis not present

## 2019-03-30 DIAGNOSIS — G248 Other dystonia: Secondary | ICD-10-CM | POA: Diagnosis not present

## 2019-04-03 DIAGNOSIS — F332 Major depressive disorder, recurrent severe without psychotic features: Secondary | ICD-10-CM | POA: Diagnosis not present

## 2019-04-24 DIAGNOSIS — F411 Generalized anxiety disorder: Secondary | ICD-10-CM | POA: Diagnosis not present

## 2019-04-25 DIAGNOSIS — N9489 Other specified conditions associated with female genital organs and menstrual cycle: Secondary | ICD-10-CM | POA: Diagnosis not present

## 2019-04-25 DIAGNOSIS — R350 Frequency of micturition: Secondary | ICD-10-CM | POA: Diagnosis not present

## 2019-04-25 DIAGNOSIS — F331 Major depressive disorder, recurrent, moderate: Secondary | ICD-10-CM | POA: Diagnosis not present

## 2019-05-10 DIAGNOSIS — M25511 Pain in right shoulder: Secondary | ICD-10-CM | POA: Diagnosis not present

## 2019-05-15 DIAGNOSIS — M25511 Pain in right shoulder: Secondary | ICD-10-CM | POA: Diagnosis not present

## 2019-05-15 DIAGNOSIS — F411 Generalized anxiety disorder: Secondary | ICD-10-CM | POA: Diagnosis not present

## 2019-05-21 DIAGNOSIS — M25511 Pain in right shoulder: Secondary | ICD-10-CM | POA: Diagnosis not present

## 2019-05-24 ENCOUNTER — Other Ambulatory Visit: Payer: Self-pay

## 2019-05-24 ENCOUNTER — Ambulatory Visit (INDEPENDENT_AMBULATORY_CARE_PROVIDER_SITE_OTHER): Payer: BC Managed Care – PPO | Admitting: Allergy & Immunology

## 2019-05-24 ENCOUNTER — Other Ambulatory Visit: Payer: Self-pay | Admitting: Neurology

## 2019-05-24 ENCOUNTER — Encounter: Payer: Self-pay | Admitting: Allergy & Immunology

## 2019-05-24 VITALS — BP 92/64 | HR 69 | Temp 97.9°F | Resp 16 | Ht 66.0 in | Wt 125.2 lb

## 2019-05-24 DIAGNOSIS — D72810 Lymphocytopenia: Secondary | ICD-10-CM | POA: Diagnosis not present

## 2019-05-24 DIAGNOSIS — R768 Other specified abnormal immunological findings in serum: Secondary | ICD-10-CM

## 2019-05-24 DIAGNOSIS — D801 Nonfamilial hypogammaglobulinemia: Secondary | ICD-10-CM

## 2019-05-24 DIAGNOSIS — D729 Disorder of white blood cells, unspecified: Secondary | ICD-10-CM | POA: Diagnosis not present

## 2019-05-24 NOTE — Patient Instructions (Addendum)
1. Hypogammaglobulinemia - We are going to repeat the labs that were abnormal at the last visit. - I think that this is likely your normal. - It is reassuring that you have not had any infections.  - We are ordering labs, so please allow 1-2 weeks for the results to come back. - With the newly implemented Cures Act, the labs might be visible to you at the same time that they become visible to me. - However, I will not address the results until all of the results come  back, so please be patient.   2. Return in about 6 months (around 11/22/2019). This can be an in-person, a virtual Webex or a telephone follow up visit.   Please inform us of any Emergency Department visits, hospitalizations, or changes in symptoms. Call us before going to the ED for breathing or allergy symptoms since we might be able to fit you in for a sick visit. Feel free to contact us anytime with any questions, problems, or concerns.  It was a pleasure to see you again today! Happy holidays!   Websites that have reliable patient information: 1. American Academy of Asthma, Allergy, and Immunology: www.aaaai.org 2. Food Allergy Research and Education (FARE): foodallergy.org 3. Mothers of Asthmatics: http://www.asthmacommunitynetwork.org 4. American College of Allergy, Asthma, and Immunology: www.acaai.org  "Like" [place] on Facebook and Instagram for our latest updates!      Make sure you are registered to vote! If you have moved or changed any of your contact information, you will need to get this updated before voting!  In some cases, you MAY be able to register to vote online: [email or web address]

## 2019-05-24 NOTE — Progress Notes (Signed)
FOLLOW UP  Date of Service/Encounter:  05/24/19   Assessment:   Hypogammaglobulinemia - with apparent B cell memory defect  Recurrent UTIs  Resolved hepatitis B infection - with positive anti-hep B core antibody  Lymphopenia - with low T cells and B cells (unclear clinical significance)  Chronic non-allergic rhinitis    Rose Austin presents for a follow-up visit.  She has done quite well in the interim without any infections.  She does still have occasional urinary tract infections, but she is following up with urology about that.  She has not required any antibiotics for sinus infections, skin infections, or other serious bacterial illnesses.  She denies any worsening fatigue or autoimmune issues.  We are going to get some repeat test since she has met her out-of-pocket deductible to see whether the results we got were a one off or her baseline.  If her IgG continues to be low and the other labs normalize, I will probably see her on an annual basis.  If all of her labs are abnormal, we may see her sooner than that.  Plan/Recommendations:   1. Hypogammaglobulinemia - We are going to repeat the labs that were abnormal at the last visit. - I think that this is likely your normal. - It is reassuring that you have not had any infections.  - We are ordering labs, so please allow 1-2 weeks for the results to come back. - With the newly implemented Cures Act, the labs might be visible to you at the same time that they become visible to me. - However, I will not address the results until all of the results come  back, so please be patient.   2. Return in about 6 months (around 11/22/2019). This can be an in-person, a virtual Webex or a telephone follow up visit.   Subjective:   Rose Austin is a 48 y.o. female presenting today for follow up of  Chief Complaint  Patient presents with  . HYPOGAMMAGLOBULINEMIA    f/u    Rose Austin has a history of the following: Patient  Active Problem List   Diagnosis Date Noted  . Recurrent UTI 03/02/2019  . Lymphopenia 03/02/2019  . Low memory T cell count determined by flow cytometry 03/02/2019  . Low immunoglobulin level 05/23/2018  . Chronic migraine 05/12/2018    History obtained from: chart review and patient.  Rose Austin is a 48 y.o. female presenting for a follow up visit. She was last seen in September 2020 for follow up of her hypogammaglobulinemia. She had testing that demonstrated a low B and T cell number as well as a switched B cell memory cells. But as she was not having any symptoms at all, we did not do anything and instead just decided to follow her clinically. She was having not recurrent infections at all aside from urinary tract infections. LDH and uric acid were normal, which was reassuring.  Since the last visit, she has done well.  She has been trying to stay safe during the coronavirus pandemic.  She had some out-of-town family in for Thanksgiving, due mostly in part to the fact that this is likely her mother's last lucid Thanksgiving.  Her mother has early onset Alzheimer's.  The family that came in is not particularly concerned with the coronavirus pandemic, although they did wear mask the entire time.  Rose Austin did get herself tested after the visit just to make sure that she had not contracted it.  This was fortunately negative.  She  has not required any antibiotics for anything other than a UTI or 2.  She is going to see a urologist about this.  She has not required any hospitalizations or IV antibiotics.  We did review medications before the first set of labs was taken and she was not on any steroids since March or April 2020, several months before I saw her.  She is not on any antiepileptics.  She did get a new dog about 3 months ago.  She previously had a dog for 17 years.  Otherwise, there have been no changes to her past medical history, surgical history, family history, or social  history.    Review of Systems  Constitutional: Negative.  Negative for fever, malaise/fatigue and weight loss.  HENT: Negative.  Negative for congestion, ear discharge and ear pain.   Eyes: Negative for pain, discharge and redness.  Respiratory: Negative for cough, sputum production, shortness of breath and wheezing.   Cardiovascular: Negative.  Negative for chest pain and palpitations.  Gastrointestinal: Negative for abdominal pain, constipation, diarrhea, heartburn, nausea and vomiting.  Skin: Negative.  Negative for itching and rash.  Neurological: Negative for dizziness and headaches.  Endo/Heme/Allergies: Negative for environmental allergies. Does not bruise/bleed easily.       Objective:   Blood pressure 92/64, pulse 69, temperature 97.9 F (36.6 C), temperature source Temporal, resp. rate 16, height 5' 6" (1.676 m), weight 125 lb 3.2 oz (56.8 kg), SpO2 97 %. Body mass index is 20.21 kg/m.   Physical Exam:  Physical Exam  Constitutional: She appears well-developed.  Well-appearing pleasant female.  HENT:  Head: Normocephalic and atraumatic.  Right Ear: Tympanic membrane, external ear and ear canal normal.  Left Ear: Tympanic membrane, external ear and ear canal normal.  Nose: Mucosal edema present. No rhinorrhea, nasal deformity or septal deviation. No epistaxis. Right sinus exhibits no maxillary sinus tenderness and no frontal sinus tenderness. Left sinus exhibits no maxillary sinus tenderness and no frontal sinus tenderness.  Mouth/Throat: Uvula is midline and oropharynx is clear and moist. Mucous membranes are not pale and not dry.  Eyes: Pupils are equal, round, and reactive to light. Conjunctivae and EOM are normal. Right eye exhibits no chemosis and no discharge. Left eye exhibits no chemosis and no discharge. Right conjunctiva is not injected. Left conjunctiva is not injected.  Cardiovascular: Normal rate, regular rhythm and normal heart sounds.  Respiratory:  Effort normal and breath sounds normal. No accessory muscle usage. No tachypnea. No respiratory distress. She has no wheezes. She has no rhonchi. She has no rales. She exhibits no tenderness.  Moving air well in all lung fields.  Lymphadenopathy:    She has no cervical adenopathy.  Neurological: She is alert.  Skin: No abrasion, no petechiae and no rash noted. Rash is not papular, not vesicular and not urticarial. No erythema. No pallor.  Psychiatric: She has a normal mood and affect.     Diagnostic studies: none (labs sent)     Salvatore Marvel, MD  [facility]

## 2019-05-25 ENCOUNTER — Encounter: Payer: Self-pay | Admitting: Allergy & Immunology

## 2019-05-25 ENCOUNTER — Other Ambulatory Visit: Payer: Self-pay | Admitting: Neurology

## 2019-05-29 DIAGNOSIS — M7501 Adhesive capsulitis of right shoulder: Secondary | ICD-10-CM | POA: Diagnosis not present

## 2019-05-29 DIAGNOSIS — M25611 Stiffness of right shoulder, not elsewhere classified: Secondary | ICD-10-CM | POA: Diagnosis not present

## 2019-05-29 DIAGNOSIS — M25511 Pain in right shoulder: Secondary | ICD-10-CM | POA: Diagnosis not present

## 2019-05-29 DIAGNOSIS — M6281 Muscle weakness (generalized): Secondary | ICD-10-CM | POA: Diagnosis not present

## 2019-05-31 LAB — LYMPH ENUMERATION, BASIC & NK CELLS
% CD 3 Pos. Lymph.: 78.3 % (ref 57.5–86.2)
% CD 4 Pos. Lymph.: 53 % (ref 30.8–58.5)
% NK (CD56/16): 11.2 % (ref 1.4–19.4)
Ab NK (CD56/16): 123 /uL (ref 24–406)
Absolute CD 3: 861 /uL (ref 622–2402)
Absolute CD 4 Helper: 583 /uL (ref 359–1519)
Basophils Absolute: 0 10*3/uL (ref 0.0–0.2)
Basos: 1 %
CD19 % B Cell: 9.1 % (ref 3.3–25.4)
CD19 Abs: 100 /uL (ref 12–645)
CD4/CD8 Ratio: 2.15 (ref 0.92–3.72)
CD8 % Suppressor T Cell: 24.7 % (ref 12.0–35.5)
CD8 T Cell Abs: 272 /uL (ref 109–897)
EOS (ABSOLUTE): 0 10*3/uL (ref 0.0–0.4)
Eos: 0 %
Hematocrit: 35.9 % (ref 34.0–46.6)
Hemoglobin: 12.3 g/dL (ref 11.1–15.9)
Immature Grans (Abs): 0 10*3/uL (ref 0.0–0.1)
Immature Granulocytes: 0 %
Lymphocytes Absolute: 1.1 10*3/uL (ref 0.7–3.1)
Lymphs: 25 %
MCH: 30.1 pg (ref 26.6–33.0)
MCHC: 34.3 g/dL (ref 31.5–35.7)
MCV: 88 fL (ref 79–97)
Monocytes Absolute: 0.5 10*3/uL (ref 0.1–0.9)
Monocytes: 11 %
Neutrophils Absolute: 2.9 10*3/uL (ref 1.4–7.0)
Neutrophils: 63 %
Platelets: 165 10*3/uL (ref 150–450)
RBC: 4.09 x10E6/uL (ref 3.77–5.28)
RDW: 11.8 % (ref 11.7–15.4)
WBC: 4.6 10*3/uL (ref 3.4–10.8)

## 2019-05-31 LAB — IGG 1, 2, 3, AND 4
IgG (Immunoglobin G), Serum: 605 mg/dL (ref 586–1602)
IgG, Subclass 1: 239 mg/dL — ABNORMAL LOW (ref 248–810)
IgG, Subclass 2: 201 mg/dL (ref 130–555)
IgG, Subclass 3: 29 mg/dL (ref 15–102)
IgG, Subclass 4: 31 mg/dL (ref 2–96)

## 2019-05-31 LAB — B CELL SUBSET ANALYSIS
Activated CD21low CD38- %: 11.8 % of CD19 — ABNORMAL HIGH (ref 1.2–9.0)
Activated CD21low CD38-: 13 cells/uL (ref 3–26)
CD19+ B cells %: 10.3 % Lymphs (ref 6.4–22.0)
CD19+ B cells: 112 cells/uL (ref 110–450)
CD20+ %: 99.8 % of CD19 (ref 96.0–100.0)
CD20+: 111 cells/uL (ref 110–450)
Class-switched CD27+IgD-IgM- %: 9 % of CD19 (ref 5.1–22.0)
Class-switched CD27+IgD-IgM-: 10 cells/uL — ABNORMAL LOW (ref 11–61)
Non switched CD27+IgD+IgM+ %: 23.7 % of CD19 — ABNORMAL HIGH (ref 2.4–15.0)
Non switched CD27+IgD+IgM+: 26 cells/uL (ref 5–46)
Plasmablasts CD38+IgM- %: 0.1 % of CD19 — ABNORMAL LOW (ref 0.4–4.1)
Plasmablasts CD38+IgM-: 0 cells/uL — ABNORMAL LOW (ref 1–8)
Total Memory CD27+ %: 42.7 % of CD19 — ABNORMAL HIGH (ref 10.0–33.0)
Total Memory CD27+: 48 cells/uL (ref 23–110)
Transitional CD38+IgM+ %: 6.1 % of CD19 — ABNORMAL HIGH (ref 0.7–5.9)
Transitional CD38+IgM+: 7 cells/uL (ref 1–17)

## 2019-05-31 LAB — IGG, IGA, IGM
IgA/Immunoglobulin A, Serum: 39 mg/dL — ABNORMAL LOW (ref 87–352)
IgM (Immunoglobulin M), Srm: 201 mg/dL (ref 26–217)

## 2019-06-04 ENCOUNTER — Other Ambulatory Visit: Payer: Self-pay | Admitting: *Deleted

## 2019-06-04 MED ORDER — RIZATRIPTAN BENZOATE 10 MG PO TBDP: ORAL_TABLET | ORAL | 1 refills | Status: DC

## 2019-06-05 DIAGNOSIS — M6281 Muscle weakness (generalized): Secondary | ICD-10-CM | POA: Diagnosis not present

## 2019-06-05 DIAGNOSIS — M25611 Stiffness of right shoulder, not elsewhere classified: Secondary | ICD-10-CM | POA: Diagnosis not present

## 2019-06-05 DIAGNOSIS — M25511 Pain in right shoulder: Secondary | ICD-10-CM | POA: Diagnosis not present

## 2019-06-05 DIAGNOSIS — M7501 Adhesive capsulitis of right shoulder: Secondary | ICD-10-CM | POA: Diagnosis not present

## 2019-06-06 ENCOUNTER — Telehealth: Payer: Self-pay | Admitting: *Deleted

## 2019-06-06 NOTE — Telephone Encounter (Signed)
PA for rizatriptan approved through covermymeds (key: [id number]).  Pt has coverage w/ [employer]. Case: [id number]. Valid through 05/26/2020.

## 2019-06-18 ENCOUNTER — Encounter: Payer: Self-pay | Admitting: Allergy & Immunology

## 2019-06-19 DIAGNOSIS — F411 Generalized anxiety disorder: Secondary | ICD-10-CM | POA: Diagnosis not present

## 2019-06-20 DIAGNOSIS — M25611 Stiffness of right shoulder, not elsewhere classified: Secondary | ICD-10-CM | POA: Diagnosis not present

## 2019-06-20 DIAGNOSIS — M7501 Adhesive capsulitis of right shoulder: Secondary | ICD-10-CM | POA: Diagnosis not present

## 2019-06-20 DIAGNOSIS — M25511 Pain in right shoulder: Secondary | ICD-10-CM | POA: Diagnosis not present

## 2019-06-20 DIAGNOSIS — M6281 Muscle weakness (generalized): Secondary | ICD-10-CM | POA: Diagnosis not present

## 2019-06-21 DIAGNOSIS — N3020 Other chronic cystitis without hematuria: Secondary | ICD-10-CM | POA: Diagnosis not present

## 2019-06-22 DIAGNOSIS — G248 Other dystonia: Secondary | ICD-10-CM | POA: Diagnosis not present

## 2019-06-26 DIAGNOSIS — M6281 Muscle weakness (generalized): Secondary | ICD-10-CM | POA: Diagnosis not present

## 2019-06-26 DIAGNOSIS — M7501 Adhesive capsulitis of right shoulder: Secondary | ICD-10-CM | POA: Diagnosis not present

## 2019-06-26 DIAGNOSIS — M25511 Pain in right shoulder: Secondary | ICD-10-CM | POA: Diagnosis not present

## 2019-06-26 DIAGNOSIS — M25611 Stiffness of right shoulder, not elsewhere classified: Secondary | ICD-10-CM | POA: Diagnosis not present

## 2019-07-03 DIAGNOSIS — F332 Major depressive disorder, recurrent severe without psychotic features: Secondary | ICD-10-CM | POA: Diagnosis not present

## 2019-07-04 DIAGNOSIS — M6281 Muscle weakness (generalized): Secondary | ICD-10-CM | POA: Diagnosis not present

## 2019-07-04 DIAGNOSIS — M7501 Adhesive capsulitis of right shoulder: Secondary | ICD-10-CM | POA: Diagnosis not present

## 2019-07-04 DIAGNOSIS — M25511 Pain in right shoulder: Secondary | ICD-10-CM | POA: Diagnosis not present

## 2019-07-04 DIAGNOSIS — M25611 Stiffness of right shoulder, not elsewhere classified: Secondary | ICD-10-CM | POA: Diagnosis not present

## 2019-07-10 DIAGNOSIS — M6281 Muscle weakness (generalized): Secondary | ICD-10-CM | POA: Diagnosis not present

## 2019-07-10 DIAGNOSIS — M7501 Adhesive capsulitis of right shoulder: Secondary | ICD-10-CM | POA: Diagnosis not present

## 2019-07-10 DIAGNOSIS — M25511 Pain in right shoulder: Secondary | ICD-10-CM | POA: Diagnosis not present

## 2019-07-10 DIAGNOSIS — M25611 Stiffness of right shoulder, not elsewhere classified: Secondary | ICD-10-CM | POA: Diagnosis not present

## 2019-07-11 ENCOUNTER — Telehealth (INDEPENDENT_AMBULATORY_CARE_PROVIDER_SITE_OTHER): Payer: BC Managed Care – PPO | Admitting: Neurology

## 2019-07-11 ENCOUNTER — Encounter: Payer: Self-pay | Admitting: Neurology

## 2019-07-11 DIAGNOSIS — G43709 Chronic migraine without aura, not intractable, without status migrainosus: Secondary | ICD-10-CM | POA: Diagnosis not present

## 2019-07-11 DIAGNOSIS — IMO0002 Reserved for concepts with insufficient information to code with codable children: Secondary | ICD-10-CM

## 2019-07-11 MED ORDER — AJOVY 225 MG/1.5ML ~~LOC~~ SOSY: 225.0000 mg | PREFILLED_SYRINGE | SUBCUTANEOUS | 11 refills | Status: DC

## 2019-07-11 MED ORDER — TOPIRAMATE 50 MG PO TABS: 50.0000 mg | ORAL_TABLET | Freq: Two times a day (BID) | ORAL | 3 refills | Status: DC

## 2019-07-11 NOTE — Progress Notes (Signed)
Virtual Visit via Video Note  I connected with Rose Austin on 07/11/19 at  3:15 PM EST by a video enabled telemedicine application and verified that I am speaking with the correct person using two identifiers.  Location: Patient: at her home Provider: in the office   I discussed the limitations of evaluation and management by telemedicine and the availability of in person appointments. The patient expressed understanding and agreed to proceed.  History of Present Illness:   Rose Austin is a 49 year old female, seen in request by her primary care PA Marda Stalker, for evaluation of migraine headaches, initial evaluation was on May 12, 2018.  I have reviewed and summarized the referring note from the referring physician.  She had a past medical history of hypothyroidism, on supplement.  She had a history of migraine headaches since her 62s, trigger for migraines are stress, sleep deprivation, weather changes, around 2017, she was getting frequent migraine headaches, her typical migraine are left-sided severe pounding headache with associated light noise sensitivity, nauseous, she was seen by neurologist, giving preventive medications Topamax 50 mg twice a day since 2018, also use Cefaly device, which has helpful,  For a while she was taking frequent over-the-counter medications ibuprofen, Excedrin Migraine, 20 migraine headache days in 1 month, she brought a migraine journal, she is now having 7-12 migraine days each month, takes Zomig 3-4 times each month as needed, but still taking frequent Excedrin Migraine, ibuprofen,  She has never tried Maxalt in the past, limited response to Imitrex.  UPDATE January 04 2019: She now has migraine 4-6 /month, add on Ajovy has made big difference, previously she was having 18-20 migraine days each month, her migraine is better controlled with Maxalt,  Update July 11, 2019 SS: Via Virtual visit, her headaches are doing really  well, even better than in July. The Ajovy has made a huge difference. In January, she had 3 migraines, before migraine treatment in 2018 having migraines 20 days a month. Is taking Topamax 50 mg BID, Ajovy 225 mg every month, Maxalt as needed. Usually Maxalt works well, sometimes she will treat milder migraines with ibuprofen. She may have to use combination of Maxalt, ibuprofen, phenergan. Tizanidine wasn't really helpful.    Observations/Objective: Via virtual visit, is alert and oriented, speech is clear and concise, sitting in front of her computer, facial symmetry noted, follows commands, no arm drift, gait is intact and steady.  Assessment and Plan: 1.  Chronic migraine headache 2.  Depression, anxiety -Overall, her headaches are under very good control, continue to improve, last month had 3 migraine headaches, good benefit with rescue medications, Ajovy has made a huge difference -Continue Ajovy 225 mg monthly injection -Continue Topamax 50 mg twice a day -Continue Maxalt as needed, may combine with Aleve, Phenergan, tizanidine was not helpful, may try Robaxin in the future if needed -Follow-up in 8 months or sooner if needed  Follow Up Instructions: 8 months, 03/13/2020 2:15   I discussed the assessment and treatment plan with the patient. The patient was provided an opportunity to ask questions and all were answered. The patient agreed with the plan and demonstrated an understanding of the instructions.   The patient was advised to call back or seek an in-person evaluation if the symptoms worsen or if the condition fails to improve as anticipated.  I provided 15 minutes of non-face-to-face time during this encounter.   Butler Denmark, Laqueta Jean, DNP  [facility] [address], [place], [place]  [address] [phone]    

## 2019-07-18 DIAGNOSIS — F332 Major depressive disorder, recurrent severe without psychotic features: Secondary | ICD-10-CM | POA: Diagnosis not present

## 2019-07-20 DIAGNOSIS — F332 Major depressive disorder, recurrent severe without psychotic features: Secondary | ICD-10-CM | POA: Diagnosis not present

## 2019-07-23 ENCOUNTER — Telehealth: Payer: Self-pay | Admitting: *Deleted

## 2019-07-23 NOTE — Telephone Encounter (Signed)
Ajovy PA approved by [employer]. Covermymeds key: [id number]. [id number]. Valid through 07/21/2020.

## 2019-07-24 DIAGNOSIS — Z23 Encounter for immunization: Secondary | ICD-10-CM | POA: Diagnosis not present

## 2019-07-25 DIAGNOSIS — F332 Major depressive disorder, recurrent severe without psychotic features: Secondary | ICD-10-CM | POA: Diagnosis not present

## 2019-07-27 DIAGNOSIS — F332 Major depressive disorder, recurrent severe without psychotic features: Secondary | ICD-10-CM | POA: Diagnosis not present

## 2019-07-31 DIAGNOSIS — F332 Major depressive disorder, recurrent severe without psychotic features: Secondary | ICD-10-CM | POA: Diagnosis not present

## 2019-08-01 DIAGNOSIS — M25511 Pain in right shoulder: Secondary | ICD-10-CM | POA: Diagnosis not present

## 2019-08-01 DIAGNOSIS — M25611 Stiffness of right shoulder, not elsewhere classified: Secondary | ICD-10-CM | POA: Diagnosis not present

## 2019-08-01 DIAGNOSIS — M6281 Muscle weakness (generalized): Secondary | ICD-10-CM | POA: Diagnosis not present

## 2019-08-01 DIAGNOSIS — M7501 Adhesive capsulitis of right shoulder: Secondary | ICD-10-CM | POA: Diagnosis not present

## 2019-08-03 DIAGNOSIS — F332 Major depressive disorder, recurrent severe without psychotic features: Secondary | ICD-10-CM | POA: Diagnosis not present

## 2019-08-07 DIAGNOSIS — F332 Major depressive disorder, recurrent severe without psychotic features: Secondary | ICD-10-CM | POA: Diagnosis not present

## 2019-08-08 NOTE — Progress Notes (Signed)
I have reviewed and agreed above plan. 

## 2019-08-09 DIAGNOSIS — H52223 Regular astigmatism, bilateral: Secondary | ICD-10-CM | POA: Diagnosis not present

## 2019-08-10 DIAGNOSIS — F332 Major depressive disorder, recurrent severe without psychotic features: Secondary | ICD-10-CM | POA: Diagnosis not present

## 2019-08-15 DIAGNOSIS — F332 Major depressive disorder, recurrent severe without psychotic features: Secondary | ICD-10-CM | POA: Diagnosis not present

## 2019-08-24 DIAGNOSIS — F332 Major depressive disorder, recurrent severe without psychotic features: Secondary | ICD-10-CM | POA: Diagnosis not present

## 2019-08-26 DIAGNOSIS — N3000 Acute cystitis without hematuria: Secondary | ICD-10-CM | POA: Diagnosis not present

## 2019-08-26 DIAGNOSIS — R350 Frequency of micturition: Secondary | ICD-10-CM | POA: Diagnosis not present

## 2019-08-28 DIAGNOSIS — F331 Major depressive disorder, recurrent, moderate: Secondary | ICD-10-CM | POA: Diagnosis not present

## 2019-08-30 DIAGNOSIS — F332 Major depressive disorder, recurrent severe without psychotic features: Secondary | ICD-10-CM | POA: Diagnosis not present

## 2019-09-06 DIAGNOSIS — F332 Major depressive disorder, recurrent severe without psychotic features: Secondary | ICD-10-CM | POA: Diagnosis not present

## 2019-09-13 DIAGNOSIS — F332 Major depressive disorder, recurrent severe without psychotic features: Secondary | ICD-10-CM | POA: Diagnosis not present

## 2019-09-14 DIAGNOSIS — G249 Dystonia, unspecified: Secondary | ICD-10-CM | POA: Diagnosis not present

## 2019-09-14 DIAGNOSIS — G248 Other dystonia: Secondary | ICD-10-CM | POA: Diagnosis not present

## 2019-09-17 DIAGNOSIS — F332 Major depressive disorder, recurrent severe without psychotic features: Secondary | ICD-10-CM | POA: Diagnosis not present

## 2019-09-19 DIAGNOSIS — E890 Postprocedural hypothyroidism: Secondary | ICD-10-CM | POA: Diagnosis not present

## 2019-09-20 DIAGNOSIS — N3020 Other chronic cystitis without hematuria: Secondary | ICD-10-CM | POA: Diagnosis not present

## 2019-09-24 DIAGNOSIS — F332 Major depressive disorder, recurrent severe without psychotic features: Secondary | ICD-10-CM | POA: Diagnosis not present

## 2019-09-25 DIAGNOSIS — D2262 Melanocytic nevi of left upper limb, including shoulder: Secondary | ICD-10-CM | POA: Diagnosis not present

## 2019-09-25 DIAGNOSIS — D225 Melanocytic nevi of trunk: Secondary | ICD-10-CM | POA: Diagnosis not present

## 2019-09-25 DIAGNOSIS — L821 Other seborrheic keratosis: Secondary | ICD-10-CM | POA: Diagnosis not present

## 2019-09-25 DIAGNOSIS — D2261 Melanocytic nevi of right upper limb, including shoulder: Secondary | ICD-10-CM | POA: Diagnosis not present

## 2019-09-25 DIAGNOSIS — L308 Other specified dermatitis: Secondary | ICD-10-CM | POA: Diagnosis not present

## 2019-09-26 DIAGNOSIS — E890 Postprocedural hypothyroidism: Secondary | ICD-10-CM | POA: Diagnosis not present

## 2019-09-26 DIAGNOSIS — R5382 Chronic fatigue, unspecified: Secondary | ICD-10-CM | POA: Diagnosis not present

## 2019-09-26 DIAGNOSIS — Z8639 Personal history of other endocrine, nutritional and metabolic disease: Secondary | ICD-10-CM | POA: Diagnosis not present

## 2019-09-26 DIAGNOSIS — G4700 Insomnia, unspecified: Secondary | ICD-10-CM | POA: Diagnosis not present

## 2019-10-04 DIAGNOSIS — F332 Major depressive disorder, recurrent severe without psychotic features: Secondary | ICD-10-CM | POA: Diagnosis not present

## 2019-10-08 DIAGNOSIS — R110 Nausea: Secondary | ICD-10-CM | POA: Diagnosis not present

## 2019-10-08 DIAGNOSIS — R0781 Pleurodynia: Secondary | ICD-10-CM | POA: Diagnosis not present

## 2019-11-15 DIAGNOSIS — F332 Major depressive disorder, recurrent severe without psychotic features: Secondary | ICD-10-CM | POA: Diagnosis not present

## 2019-11-22 ENCOUNTER — Ambulatory Visit: Payer: BC Managed Care – PPO | Admitting: Allergy & Immunology

## 2019-11-22 DIAGNOSIS — F332 Major depressive disorder, recurrent severe without psychotic features: Secondary | ICD-10-CM | POA: Diagnosis not present

## 2019-11-23 DIAGNOSIS — Z0000 Encounter for general adult medical examination without abnormal findings: Secondary | ICD-10-CM | POA: Diagnosis not present

## 2019-11-23 DIAGNOSIS — Z13220 Encounter for screening for lipoid disorders: Secondary | ICD-10-CM | POA: Diagnosis not present

## 2019-11-29 DIAGNOSIS — F332 Major depressive disorder, recurrent severe without psychotic features: Secondary | ICD-10-CM | POA: Diagnosis not present

## 2019-11-29 DIAGNOSIS — F341 Dysthymic disorder: Secondary | ICD-10-CM | POA: Diagnosis not present

## 2019-11-30 DIAGNOSIS — F332 Major depressive disorder, recurrent severe without psychotic features: Secondary | ICD-10-CM | POA: Diagnosis not present

## 2019-12-03 DIAGNOSIS — Z0000 Encounter for general adult medical examination without abnormal findings: Secondary | ICD-10-CM | POA: Diagnosis not present

## 2019-12-07 DIAGNOSIS — G248 Other dystonia: Secondary | ICD-10-CM | POA: Diagnosis not present

## 2019-12-07 DIAGNOSIS — G249 Dystonia, unspecified: Secondary | ICD-10-CM | POA: Diagnosis not present

## 2019-12-13 DIAGNOSIS — F332 Major depressive disorder, recurrent severe without psychotic features: Secondary | ICD-10-CM | POA: Diagnosis not present

## 2019-12-16 DIAGNOSIS — F332 Major depressive disorder, recurrent severe without psychotic features: Secondary | ICD-10-CM | POA: Diagnosis not present

## 2019-12-19 IMAGING — MG MM DIGITAL SCREENING BILAT W/ TOMO W/ CAD
8 series · 9 of 24 positions shown · non-contrast
Comparison: Previous exam(s).

CLINICAL DATA: Screening.

EXAM:
DIGITAL SCREENING BILATERAL MAMMOGRAM WITH TOMO AND CAD

[R MLO synth-2D]
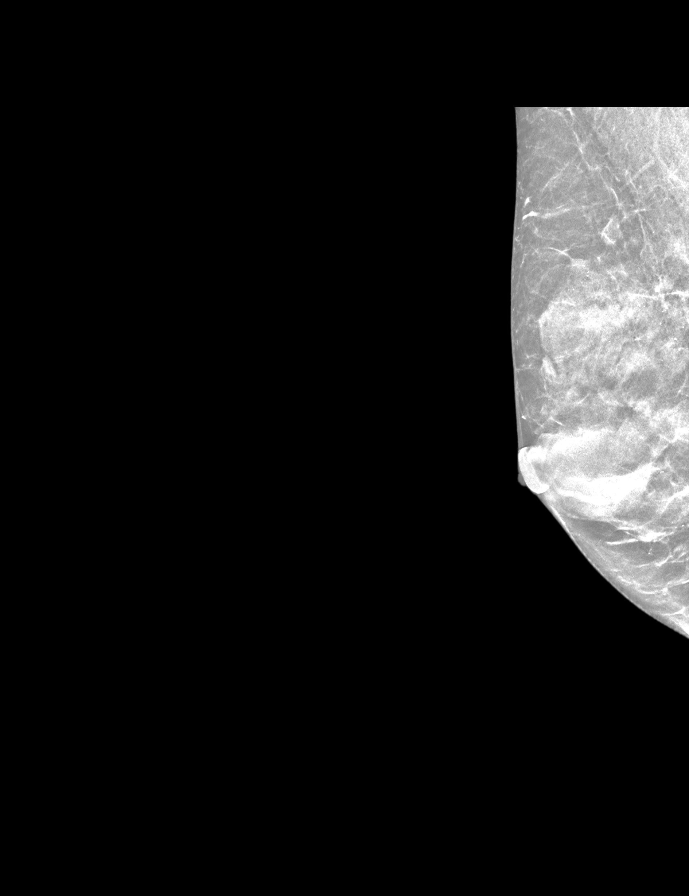

[R CC synth-2D]
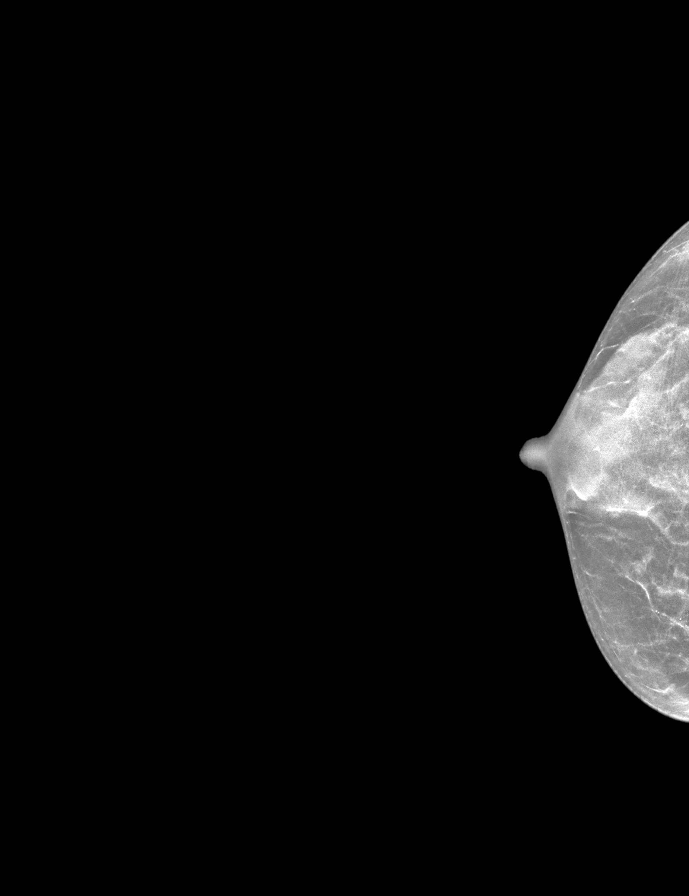

[L CC synth-2D]
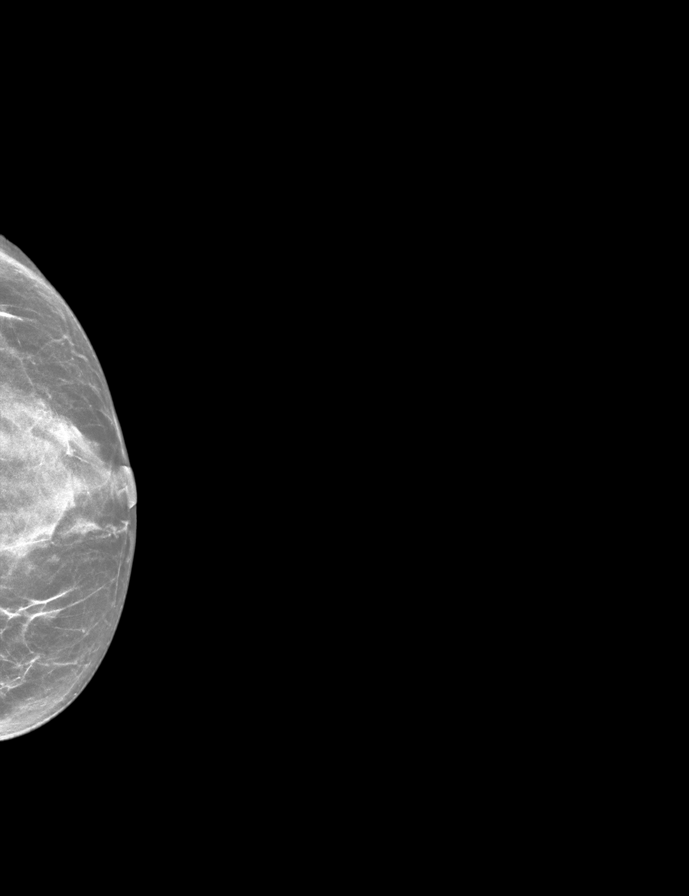

[L MLO synth-2D]
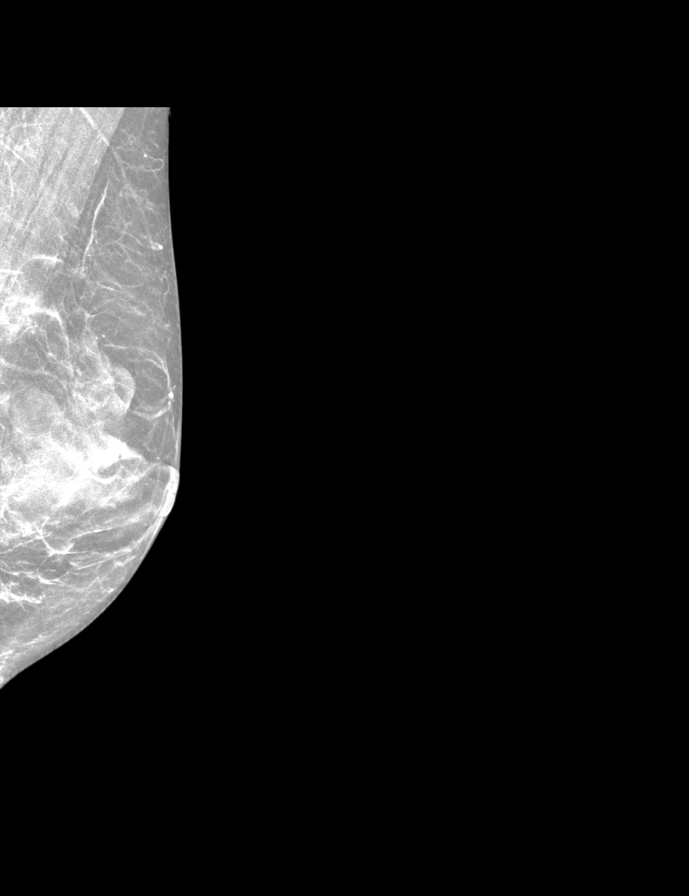

[R MLO tomo · 2 of 36 frames shown]
[frame 12/36]
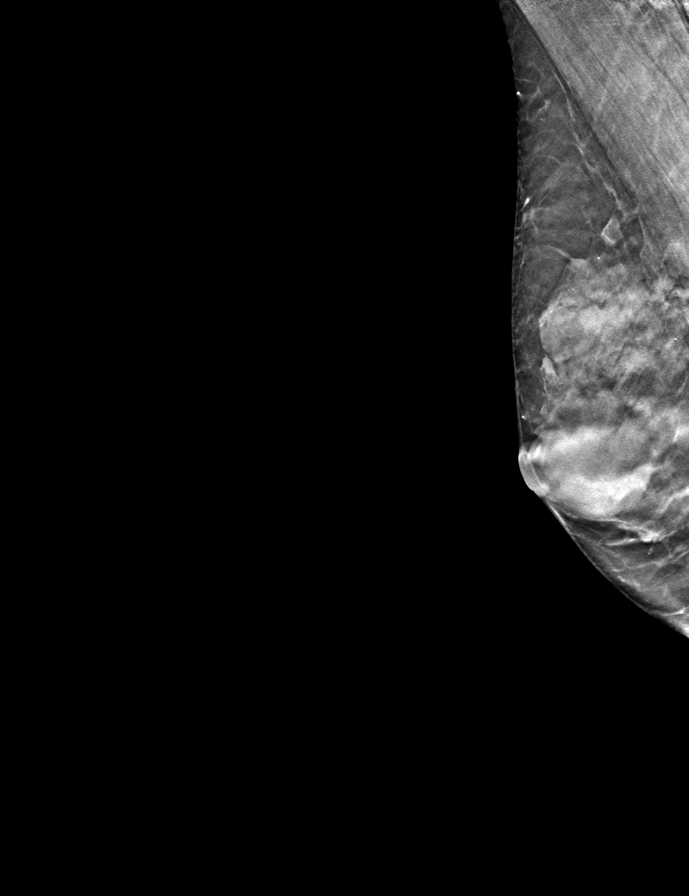
[frame 19/36]
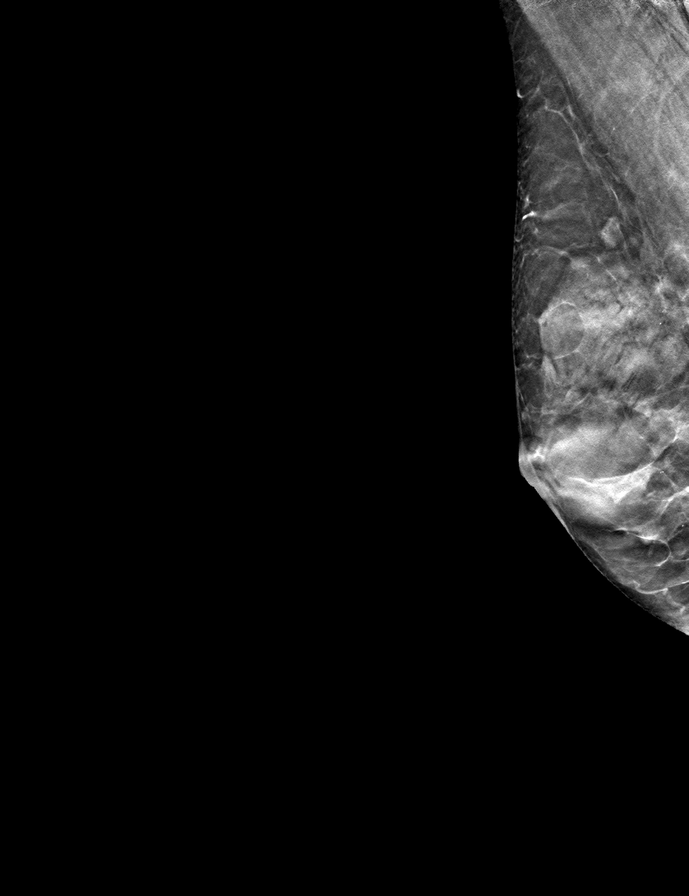

[L CC tomo · tomo slice 21/40.0]
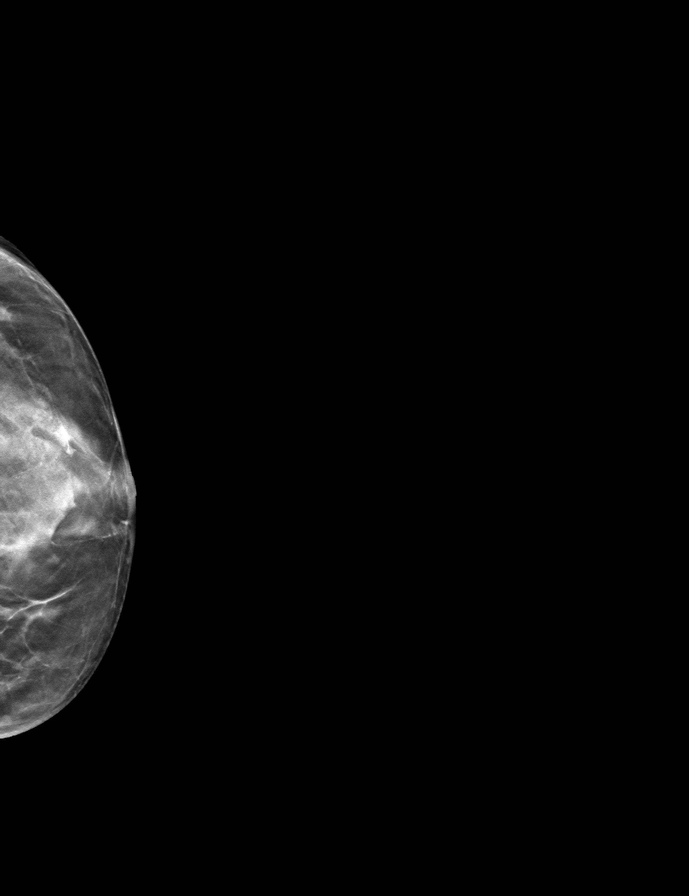

[L MLO tomo · tomo slice 21/40.0]
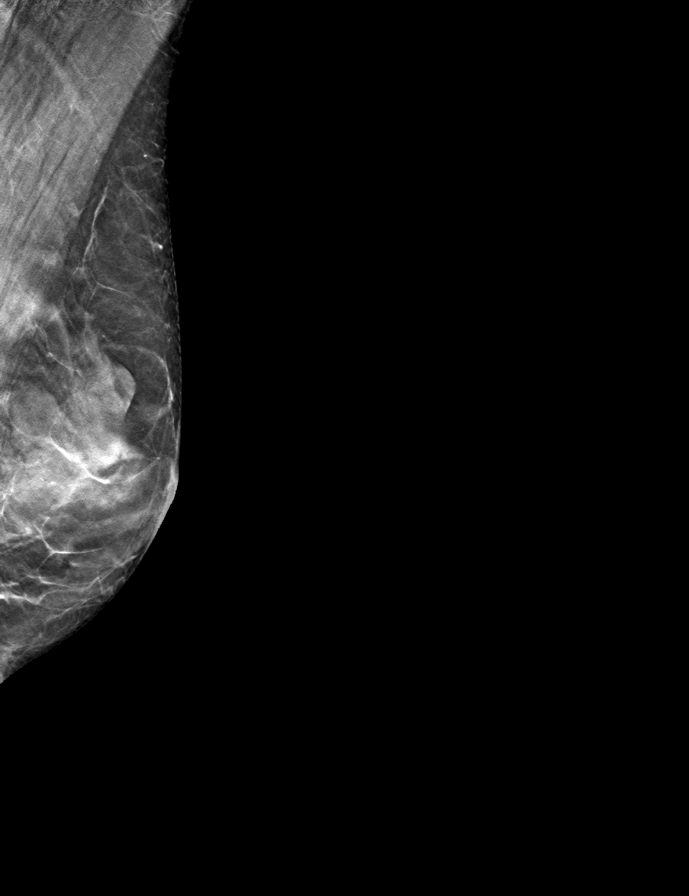

[R CC tomo · tomo slice 19/37.0]
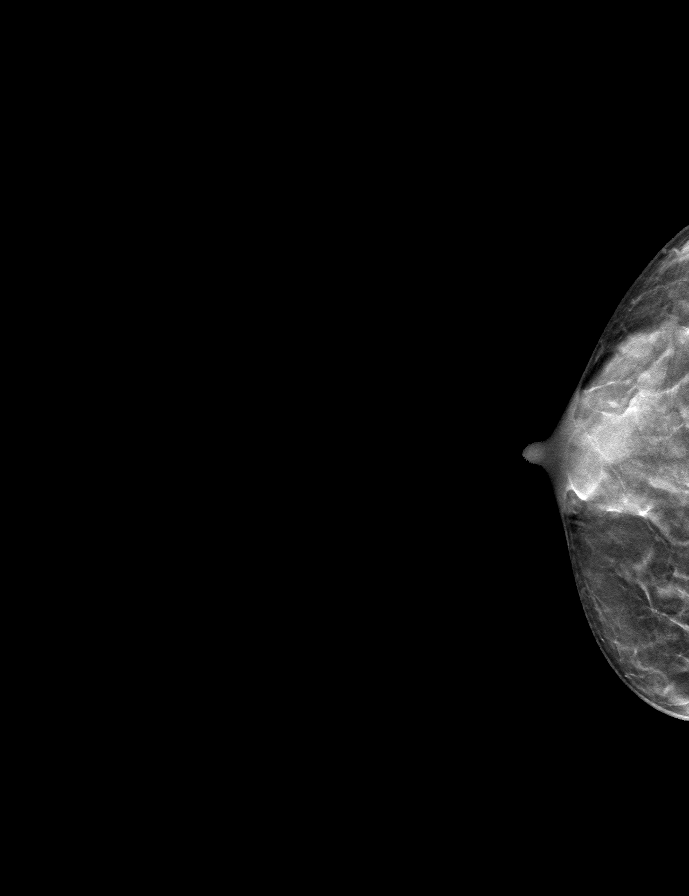

[9 of 24 positions shown; findings below may reference images not displayed]

ACR Breast Density Category d: The breast tissue is extremely dense,
which lowers the sensitivity of mammography
FINDINGS: There are no findings suspicious for malignancy. Images were
processed with CAD.
IMPRESSION: No mammographic evidence of malignancy. A result letter of this
screening mammogram will be mailed directly to the patient.

RECOMMENDATION:
Screening mammogram in one year. (Code:[id number])

BI-RADS CATEGORY  1: Negative.

## 2019-12-20 DIAGNOSIS — F332 Major depressive disorder, recurrent severe without psychotic features: Secondary | ICD-10-CM | POA: Diagnosis not present

## 2020-01-03 DIAGNOSIS — F332 Major depressive disorder, recurrent severe without psychotic features: Secondary | ICD-10-CM | POA: Diagnosis not present

## 2020-01-14 DIAGNOSIS — F332 Major depressive disorder, recurrent severe without psychotic features: Secondary | ICD-10-CM | POA: Diagnosis not present

## 2020-01-17 DIAGNOSIS — F332 Major depressive disorder, recurrent severe without psychotic features: Secondary | ICD-10-CM | POA: Diagnosis not present

## 2020-01-20 DIAGNOSIS — F332 Major depressive disorder, recurrent severe without psychotic features: Secondary | ICD-10-CM | POA: Diagnosis not present

## 2020-01-22 ENCOUNTER — Ambulatory Visit (INDEPENDENT_AMBULATORY_CARE_PROVIDER_SITE_OTHER): Payer: BC Managed Care – PPO | Admitting: Allergy & Immunology

## 2020-01-22 ENCOUNTER — Other Ambulatory Visit: Payer: Self-pay

## 2020-01-22 ENCOUNTER — Encounter: Payer: Self-pay | Admitting: Allergy & Immunology

## 2020-01-22 VITALS — BP 96/66 | HR 70 | Temp 98.2°F | Resp 16

## 2020-01-22 DIAGNOSIS — D729 Disorder of white blood cells, unspecified: Secondary | ICD-10-CM

## 2020-01-22 DIAGNOSIS — D72810 Lymphocytopenia: Secondary | ICD-10-CM

## 2020-01-22 DIAGNOSIS — D801 Nonfamilial hypogammaglobulinemia: Secondary | ICD-10-CM

## 2020-01-22 DIAGNOSIS — R768 Other specified abnormal immunological findings in serum: Secondary | ICD-10-CM

## 2020-01-22 NOTE — Progress Notes (Signed)
FOLLOW UP  Date of Service/Encounter:  01/22/20   Assessment:   Hypogammaglobulinemia   Low memory T cell count determined by flow cytometry  Lymphopenia   Overall, Rose is doing very well.  She has not had any breakthrough infections since last time we saw her.  A lot of her abnormalities had nearly normalized completely at the last visit, but she would like to make sure that they are still normal to make sure that the fluke was the initial labs and not the follow-up labs.  He has met her out-of-pocket deductible and is very comfortable with going ahead and getting these labs done.  I do not anticipate needing to see her again.  Therefore, she will follow-up as needed.  Plan/Recommendations:   1. Hypogammaglobulinemia - We are going to repeat the labs that were abnormal to make sure that the normal ones last time were not a fluke. - I will contact you when the labs come back. - Otherwise I do not think that we need to see you again unless it is needed.   2. Follow up as needed.   Subjective:   Rose Austin is a 49 y.o. female presenting today for follow up of  Chief Complaint  Patient presents with  . Follow-up    Rose Austin has a history of the following: Patient Active Problem List   Diagnosis Date Noted  . Recurrent UTI 03/02/2019  . Lymphopenia 03/02/2019  . Low memory T cell count determined by flow cytometry 03/02/2019  . Low immunoglobulin level 05/23/2018  . Chronic migraine 05/12/2018    History obtained from: chart review and patient.  Rose is a 49 y.o. female presenting for a follow up visit.  She was last seen in December 2020.  At that time, we decided just to continue to monitor.  She was not having any recurrent infections aside from occasional UTIs.  She was not having any fatigue or autoimmune issues.  Her initial evaluation was due to hypogammaglobulinemia.  Flow cytometry demonstrated a B-cell memory defect.  She also had  lymphopenia with low B and T cells.  Last labs at the end of 2020 demonstrated T-cell and B-cell abnormalities that had completely normalized.  Her immunoglobulin levels were looking great.  Since the last visit, she has done fairly well. She is going to be signing up for new insurance in November 2021. She has not had any problems with infections at all since the last visit. She did get put on Macrobid by her urologist for three months. She has noticed that they have been cyclical. She is using Primarin cream.   She did get her COVID19 vaccination. She had no problems with this at all. She continues to have her Graves disease which is mostly stable. She gets her medications tweaked occasionally. She is getting her labs checked a couple of times per year.   She has had no problems with weight loss at all. She has been experiencing fatigue lately which she thinks is stress related.   Otherwise, there have been no changes to her past medical history, surgical history, family history, or social history.    Review of Systems  Constitutional: Positive for malaise/fatigue. Negative for chills, fever and weight loss.  HENT: Negative.  Negative for congestion, ear discharge, ear pain and sinus pain.        Positive for rhinorrhea.  Eyes: Negative for pain, discharge and redness.  Respiratory: Negative for cough, sputum production, shortness of breath and wheezing.  Cardiovascular: Negative.  Negative for chest pain and palpitations.  Gastrointestinal: Negative for abdominal pain, constipation, diarrhea, heartburn, nausea and vomiting.  Skin: Negative.  Negative for itching and rash.  Neurological: Negative for dizziness and headaches.  Endo/Heme/Allergies: Negative for environmental allergies. Does not bruise/bleed easily.       Objective:   Blood pressure 96/66, pulse 70, temperature 98.2 F (36.8 C), temperature source Temporal, resp. rate 16, SpO2 98 %. There is no height or weight on  file to calculate BMI.   Physical Exam:  Physical Exam Constitutional:      Appearance: She is well-developed.     Comments: Very pleasant female. Cooperative with the exam.   HENT:     Head: Normocephalic and atraumatic.     Right Ear: Tympanic membrane, ear canal and external ear normal.     Left Ear: Tympanic membrane, ear canal and external ear normal.     Nose: No nasal deformity, septal deviation, mucosal edema or rhinorrhea.     Right Turbinates: Not enlarged, swollen or pale.     Left Turbinates: Not enlarged, swollen or pale.     Right Sinus: No maxillary sinus tenderness or frontal sinus tenderness.     Left Sinus: No maxillary sinus tenderness or frontal sinus tenderness.     Comments: Scant clear rhinorrhea.    Mouth/Throat:     Mouth: Mucous membranes are not pale and not dry.     Pharynx: Uvula midline.     Comments: Minimal cobblestoning.  Eyes:     General:        Right eye: No discharge.        Left eye: No discharge.     Conjunctiva/sclera: Conjunctivae normal.     Right eye: Right conjunctiva is not injected. No chemosis.    Left eye: Left conjunctiva is not injected. No chemosis.    Pupils: Pupils are equal, round, and reactive to light.  Cardiovascular:     Rate and Rhythm: Normal rate and regular rhythm.     Heart sounds: Normal heart sounds.  Pulmonary:     Effort: Pulmonary effort is normal. No tachypnea, accessory muscle usage or respiratory distress.     Breath sounds: Normal breath sounds. No wheezing, rhonchi or rales.  Chest:     Chest wall: No tenderness.  Lymphadenopathy:     Cervical: No cervical adenopathy.  Skin:    Coloration: Skin is not pale.     Findings: No abrasion, erythema, petechiae or rash. Rash is not papular, urticarial or vesicular.  Neurological:     Mental Status: She is alert.  Psychiatric:        Behavior: Behavior is cooperative.      Diagnostic studies: labs sent instead      Salvatore Marvel, MD  Allergy and  [facility]

## 2020-01-22 NOTE — Patient Instructions (Addendum)
1. Hypogammaglobulinemia - We are going to repeat the labs that were abnormal to make sure that the normal ones last time were not a fluke. - I will contact you when the labs come back. - Otherwise I do not think that we need to see you again unless it is needed.   2. Follow up as needed.    Please inform us of any Emergency Department visits, hospitalizations, or changes in symptoms. Call us before going to the ED for breathing or allergy symptoms since we might be able to fit you in for a sick visit. Feel free to contact us anytime with any questions, problems, or concerns.  It was a pleasure to see you and your family again today!  Websites that have reliable patient information: 1. American Academy of Asthma, Allergy, and Immunology: www.aaaai.org 2. Food Allergy Research and Education (FARE): foodallergy.org 3. Mothers of Asthmatics: http://www.asthmacommunitynetwork.org 4. American College of Allergy, Asthma, and Immunology: www.acaai.org   COVID-19 Vaccine Information can be found at: [email or web address].co.uk For questions related to vaccine distribution or appointments, please email [email or web address] or call [phone].     "Like" [place] on Facebook and Instagram for our latest updates!        Make sure you are registered to vote! If you have moved or changed any of your contact information, you will need to get this updated before voting!  In some cases, you MAY be able to register to vote online: [email or web address]

## 2020-01-23 ENCOUNTER — Encounter: Payer: Self-pay | Admitting: Allergy & Immunology

## 2020-01-24 DIAGNOSIS — F332 Major depressive disorder, recurrent severe without psychotic features: Secondary | ICD-10-CM | POA: Diagnosis not present

## 2020-01-28 DIAGNOSIS — F332 Major depressive disorder, recurrent severe without psychotic features: Secondary | ICD-10-CM | POA: Diagnosis not present

## 2020-01-31 DIAGNOSIS — F332 Major depressive disorder, recurrent severe without psychotic features: Secondary | ICD-10-CM | POA: Diagnosis not present

## 2020-02-04 LAB — LYMPH ENUMERATION, BASIC & NK CELLS
% CD 3 Pos. Lymph.: 78.3 % (ref 57.5–86.2)
% CD 4 Pos. Lymph.: 53.7 % (ref 30.8–58.5)
% NK (CD56/16): 10.1 % (ref 1.4–19.4)
Ab NK (CD56/16): 101 /uL (ref 24–406)
Absolute CD 3: 783 /uL (ref 622–2402)
Absolute CD 4 Helper: 537 /uL (ref 359–1519)
Basophils Absolute: 0 10*3/uL (ref 0.0–0.2)
Basos: 0 %
CD19 % B Cell: 9.7 % (ref 3.3–25.4)
CD19 Abs: 97 /uL (ref 12–645)
CD4/CD8 Ratio: 2.15 (ref 0.92–3.72)
CD8 % Suppressor T Cell: 25 % (ref 12.0–35.5)
CD8 T Cell Abs: 250 /uL (ref 109–897)
EOS (ABSOLUTE): 0.1 10*3/uL (ref 0.0–0.4)
Eos: 2 %
Hematocrit: 39.3 % (ref 34.0–46.6)
Hemoglobin: 13.6 g/dL (ref 11.1–15.9)
Immature Grans (Abs): 0 10*3/uL (ref 0.0–0.1)
Immature Granulocytes: 0 %
Lymphocytes Absolute: 1 10*3/uL (ref 0.7–3.1)
Lymphs: 21 %
MCH: 30 pg (ref 26.6–33.0)
MCHC: 34.6 g/dL (ref 31.5–35.7)
MCV: 87 fL (ref 79–97)
Monocytes Absolute: 0.5 10*3/uL (ref 0.1–0.9)
Monocytes: 10 %
Neutrophils Absolute: 3.3 10*3/uL (ref 1.4–7.0)
Neutrophils: 67 %
Platelets: 202 10*3/uL (ref 150–450)
RBC: 4.53 x10E6/uL (ref 3.77–5.28)
RDW: 11.8 % (ref 11.7–15.4)
WBC: 5 10*3/uL (ref 3.4–10.8)

## 2020-02-04 LAB — IGG, IGA, IGM
IgA/Immunoglobulin A, Serum: 41 mg/dL — ABNORMAL LOW (ref 87–352)
IgG (Immunoglobin G), Serum: 595 mg/dL (ref 586–1602)
IgM (Immunoglobulin M), Srm: 209 mg/dL (ref 26–217)

## 2020-02-04 LAB — B CELL SUBSET ANALYSIS
Activated CD21low CD38- %: 5.1 % of CD19 (ref 1.2–9.0)
Activated CD21low CD38-: 5 cells/uL (ref 3–26)
CD19+ B cells %: 9.3 % Lymphs (ref 6.4–22.0)
CD19+ B cells: 103 cells/uL — ABNORMAL LOW (ref 110–450)
CD20+ %: 99.8 % of CD19 (ref 96.0–100.0)
CD20+: 103 cells/uL — ABNORMAL LOW (ref 110–450)
Class-switched CD27+IgD-IgM- %: 2.9 % of CD19 — ABNORMAL LOW (ref 5.1–22.0)
Class-switched CD27+IgD-IgM-: 3 cells/uL — ABNORMAL LOW (ref 11–61)
Non switched CD27+IgD+IgM+ %: 10.3 % of CD19 (ref 2.4–15.0)
Non switched CD27+IgD+IgM+: 11 cells/uL (ref 5–46)
Plasmablasts CD38+IgM- %: 0.3 % of CD19 — ABNORMAL LOW (ref 0.4–4.1)
Plasmablasts CD38+IgM-: 0 cells/uL — ABNORMAL LOW (ref 1–8)
Total Memory CD27+ %: 19 % of CD19 (ref 10.0–33.0)
Total Memory CD27+: 20 cells/uL — ABNORMAL LOW (ref 23–110)
Transitional CD38+IgM+ %: 5.6 % of CD19 (ref 0.7–5.9)
Transitional CD38+IgM+: 6 cells/uL (ref 1–17)

## 2020-02-04 LAB — T-HELPER CELLS CD4/CD8 %
% CD 4 Pos. Lymph.: 55.2 % (ref 30.8–58.5)
Absolute CD 4 Helper: 552 /uL (ref 359–1519)
CD3+CD4+ Cells/CD3+CD8+ Cells Bld: 2.3 (ref 0.92–3.72)
CD3+CD8+ Cells # Bld: 240 /uL (ref 109–897)
CD3+CD8+ Cells NFr Bld: 24 % (ref 12.0–35.5)

## 2020-02-05 DIAGNOSIS — F332 Major depressive disorder, recurrent severe without psychotic features: Secondary | ICD-10-CM | POA: Diagnosis not present

## 2020-02-07 DIAGNOSIS — F332 Major depressive disorder, recurrent severe without psychotic features: Secondary | ICD-10-CM | POA: Diagnosis not present

## 2020-02-14 DIAGNOSIS — F332 Major depressive disorder, recurrent severe without psychotic features: Secondary | ICD-10-CM | POA: Diagnosis not present

## 2020-02-17 DIAGNOSIS — F332 Major depressive disorder, recurrent severe without psychotic features: Secondary | ICD-10-CM | POA: Diagnosis not present

## 2020-02-22 ENCOUNTER — Other Ambulatory Visit: Payer: Self-pay | Admitting: Neurology

## 2020-02-22 DIAGNOSIS — F332 Major depressive disorder, recurrent severe without psychotic features: Secondary | ICD-10-CM | POA: Diagnosis not present

## 2020-02-25 DIAGNOSIS — F332 Major depressive disorder, recurrent severe without psychotic features: Secondary | ICD-10-CM | POA: Diagnosis not present

## 2020-02-26 NOTE — Telephone Encounter (Signed)
Can someone call to schedule her for intradermal testing?  Salvatore Marvel, MD All[facility] and [facility]

## 2020-02-26 NOTE — Telephone Encounter (Signed)
Left voicemail to schedule intradermal testing.

## 2020-02-28 DIAGNOSIS — F332 Major depressive disorder, recurrent severe without psychotic features: Secondary | ICD-10-CM | POA: Diagnosis not present

## 2020-02-29 DIAGNOSIS — G248 Other dystonia: Secondary | ICD-10-CM | POA: Diagnosis not present

## 2020-03-10 DIAGNOSIS — F332 Major depressive disorder, recurrent severe without psychotic features: Secondary | ICD-10-CM | POA: Diagnosis not present

## 2020-03-13 ENCOUNTER — Ambulatory Visit: Payer: BC Managed Care – PPO | Admitting: Neurology

## 2020-03-17 ENCOUNTER — Other Ambulatory Visit: Payer: Self-pay | Admitting: Neurology

## 2020-03-26 DIAGNOSIS — F3342 Major depressive disorder, recurrent, in full remission: Secondary | ICD-10-CM | POA: Diagnosis not present

## 2020-03-27 DIAGNOSIS — N3020 Other chronic cystitis without hematuria: Secondary | ICD-10-CM | POA: Diagnosis not present

## 2020-03-31 DIAGNOSIS — E890 Postprocedural hypothyroidism: Secondary | ICD-10-CM | POA: Diagnosis not present

## 2020-03-31 DIAGNOSIS — R5382 Chronic fatigue, unspecified: Secondary | ICD-10-CM | POA: Diagnosis not present

## 2020-03-31 DIAGNOSIS — M1990 Unspecified osteoarthritis, unspecified site: Secondary | ICD-10-CM | POA: Diagnosis not present

## 2020-04-02 DIAGNOSIS — Z23 Encounter for immunization: Secondary | ICD-10-CM | POA: Diagnosis not present

## 2020-04-02 DIAGNOSIS — G4700 Insomnia, unspecified: Secondary | ICD-10-CM | POA: Diagnosis not present

## 2020-04-02 DIAGNOSIS — E890 Postprocedural hypothyroidism: Secondary | ICD-10-CM | POA: Diagnosis not present

## 2020-04-02 DIAGNOSIS — Z8639 Personal history of other endocrine, nutritional and metabolic disease: Secondary | ICD-10-CM | POA: Diagnosis not present

## 2020-04-02 DIAGNOSIS — R5382 Chronic fatigue, unspecified: Secondary | ICD-10-CM | POA: Diagnosis not present

## 2020-04-03 DIAGNOSIS — F332 Major depressive disorder, recurrent severe without psychotic features: Secondary | ICD-10-CM | POA: Diagnosis not present

## 2020-04-07 DIAGNOSIS — F332 Major depressive disorder, recurrent severe without psychotic features: Secondary | ICD-10-CM | POA: Diagnosis not present

## 2020-04-10 DIAGNOSIS — F332 Major depressive disorder, recurrent severe without psychotic features: Secondary | ICD-10-CM | POA: Diagnosis not present

## 2020-04-15 ENCOUNTER — Ambulatory Visit (INDEPENDENT_AMBULATORY_CARE_PROVIDER_SITE_OTHER): Payer: BC Managed Care – PPO | Admitting: Allergy & Immunology

## 2020-04-15 ENCOUNTER — Encounter: Payer: Self-pay | Admitting: Allergy & Immunology

## 2020-04-15 ENCOUNTER — Other Ambulatory Visit: Payer: Self-pay

## 2020-04-15 VITALS — BP 94/72 | HR 80 | Temp 98.2°F | Resp 17

## 2020-04-15 DIAGNOSIS — D72810 Lymphocytopenia: Secondary | ICD-10-CM | POA: Diagnosis not present

## 2020-04-15 DIAGNOSIS — D801 Nonfamilial hypogammaglobulinemia: Secondary | ICD-10-CM

## 2020-04-15 DIAGNOSIS — J310 Chronic rhinitis: Secondary | ICD-10-CM

## 2020-04-15 DIAGNOSIS — R768 Other specified abnormal immunological findings in serum: Secondary | ICD-10-CM | POA: Diagnosis not present

## 2020-04-15 NOTE — Patient Instructions (Addendum)
1. Chronic rhinitis - Testing to all of the intradermal testing (VERY sensitive testing) was negative. - This could be non-allergic rhinitis or something called local allergic rhinitis. - This may be what is known as "local allergic rhinitis", which occurs when your immune system makes localized IgE (allergy antibodies) to various allergens (as opposed to systemic) IgE - Therefore, since IgE is not make systemically, allergic sensitizations are not picked up on routine skin testing. - There are specialized academic research centers that can test for IgE in the nasal cavity, but this is not done in the clinical setting at this time.  - It can still be treated with nasal sprays and antihistamines.  - We will start a nasal spray to help with postnasal drip: ipratropium one spray per nostril twice daily (can be OVER drying, so be careful).  - Continue with Zyrtec one tablet daily.  2. Return in about 6 months (around 10/13/2020).    Please inform us of any Emergency Department visits, hospitalizations, or changes in symptoms. Call us before going to the ED for breathing or allergy symptoms since we might be able to fit you in for a sick visit. Feel free to contact us anytime with any questions, problems, or concerns.  It was a pleasure to see you again today!  Websites that have reliable patient information: 1. American Academy of Asthma, Allergy, and Immunology: www.aaaai.org 2. Food Allergy Research and Education (FARE): foodallergy.org 3. Mothers of Asthmatics: http://www.asthmacommunitynetwork.org 4. American College of Allergy, Asthma, and Immunology: www.acaai.org   COVID-19 Vaccine Information can be found at: ShippingScam.co.uk For questions related to vaccine distribution or appointments, please email [email or web address] or call [phone].     "Like" [place] on Facebook and Instagram for our latest updates!     HAPPY FALL!      Make sure you are registered to vote! If you have moved or changed any of your contact information, you will need to get this updated before voting!  In some cases, you MAY be able to register to vote online: [email or web address]

## 2020-04-15 NOTE — Progress Notes (Signed)
FOLLOW UP   Date of Service/Encounter:  04/15/20   Assessment:   Hypogammaglobulinemia   Low memory T cell count determined by flow cytometry  Lymphopenia  Chronic non-allergic rhinitis   Plan/Recommendations:   1. Chronic rhinitis - Testing to all of the intradermal testing (VERY sensitive testing) was negative. - This could be non-allergic rhinitis or something called local allergic rhinitis. - This may be what is known as "local allergic rhinitis", which occurs when your immune system makes localized IgE (allergy antibodies) to various allergens (as opposed to systemic) IgE - Therefore, since IgE is not make systemically, allergic sensitizations are not picked up on routine skin testing. - There are specialized academic research centers that can test for IgE in the nasal cavity, but this is not done in the clinical setting at this time.  - It can still be treated with nasal sprays and antihistamines.  - We will start a nasal spray to help with postnasal drip: ipratropium one spray per nostril twice daily (can be OVER drying, so be careful).  - Continue with Zyrtec one tablet daily.  2. Return in about 6 months (around 10/13/2020).    Subjective:   Rose Austin is a 49 y.o. female presenting today for follow up of  Chief Complaint  Patient presents with  . Allergy Testing    IDs    Rose Austin has a history of the following: Patient Active Problem List   Diagnosis Date Noted  . Recurrent UTI 03/02/2019  . Lymphopenia 03/02/2019  . Low memory T cell count determined by flow cytometry 03/02/2019  . Low immunoglobulin level 05/23/2018  . Chronic migraine 05/12/2018    History obtained from: chart review and patient.  Rose is a 49 y.o. female presenting for skin testing.  She was last seen in the office in August 2021.  She has a history of hypogammaglobulinemia with some B-cell deficiencies flow cytometry, but we will monitor this every the course of  1 to 2 years it has remained stable or normalized.  In any case, we received a message from her in September 2021 complaining about a constant runny nose.  We had already done environmental allergy panel via the blood which was negative when I first met her.  Therefore, we decided to bring her in for intradermal testing.  Since last visit, she has done well. She has been taking cetirizine daily for ten years. She also has a saline nasal spray twice daily.  Runny nose is her biggest complaint at this point. She does have a coughing fit occasionally. This can be more of a dryer cough and she will hack up phelgmy stuff.  He has never been allergy tested in the past.  She has only ever been on a nasal steroid.  Now she uses saline more than the steroid.  Overall, she has been doing fairly well.  Her mother is now on a memory unit.  Her dad is still living in their house.  That fast progression of her mother's deterioration has been difficult on her.  Otherwise, there have been no changes to her past medical history, surgical history, family history, or social history.    Review of Systems  Constitutional: Negative.  Negative for chills, fever, malaise/fatigue and weight loss.  HENT: Negative for congestion, ear discharge, ear pain and sinus pain.        Positive for rhinorrhea.  Eyes: Negative for pain, discharge and redness.  Respiratory: Negative for cough, sputum production, shortness of  breath and wheezing.   Cardiovascular: Negative.  Negative for chest pain and palpitations.  Gastrointestinal: Negative for abdominal pain, constipation, diarrhea, heartburn, nausea and vomiting.  Skin: Negative.  Negative for itching and rash.  Neurological: Negative for dizziness and headaches.  Endo/Heme/Allergies: Negative for environmental allergies. Does not bruise/bleed easily.       Objective:   Blood pressure 94/72, pulse 80, temperature 98.2 F (36.8 C), temperature source Temporal, resp. rate  17, SpO2 98 %. There is no height or weight on file to calculate BMI.   Physical Exam:  Physical Exam Constitutional:      Appearance: She is well-developed.  HENT:     Head: Normocephalic and atraumatic.     Right Ear: Tympanic membrane, ear canal and external ear normal.     Left Ear: Tympanic membrane, ear canal and external ear normal.     Nose: No nasal deformity, septal deviation, mucosal edema or rhinorrhea.     Right Turbinates: Enlarged and swollen.     Left Turbinates: Enlarged and swollen.     Right Sinus: No maxillary sinus tenderness or frontal sinus tenderness.     Left Sinus: No maxillary sinus tenderness or frontal sinus tenderness.     Mouth/Throat:     Mouth: Mucous membranes are not pale and not dry.     Pharynx: Uvula midline.  Eyes:     General:        Right eye: No discharge.        Left eye: No discharge.     Conjunctiva/sclera: Conjunctivae normal.     Right eye: Right conjunctiva is not injected. No chemosis.    Left eye: Left conjunctiva is not injected. No chemosis.    Pupils: Pupils are equal, round, and reactive to light.  Cardiovascular:     Rate and Rhythm: Normal rate and regular rhythm.     Heart sounds: Normal heart sounds.  Pulmonary:     Effort: Pulmonary effort is normal. No tachypnea, accessory muscle usage or respiratory distress.     Breath sounds: Normal breath sounds. No wheezing, rhonchi or rales.  Chest:     Chest wall: No tenderness.  Lymphadenopathy:     Cervical: No cervical adenopathy.  Skin:    Coloration: Skin is not pale.     Findings: No abrasion, erythema, petechiae or rash.  Neurological:     Mental Status: She is alert.  Psychiatric:        Behavior: Behavior is cooperative.      Diagnostic studies:   Allergy Studies:     Intradermal - 04/15/20 1442    Time Antigen Placed 1442    Allergen Manufacturer Lavella Hammock    Location Arm    Number of Test 15    Intradermal Select    Control Negative    [place]  Negative    Johnson Negative    7 Grass Negative    Ragweed mix Negative    Weed mix Negative    Tree mix Negative    Mold 1 Negative    Mold 2 Negative    Mold 3 Negative    Mold 4 Negative    Cat Negative    Dog Negative    Cockroach Negative    Mite mix Negative           Allergy testing results were read and interpreted by myself, documented by clinical staff.      Salvatore Marvel, MD  [facility]

## 2020-04-16 DIAGNOSIS — F332 Major depressive disorder, recurrent severe without psychotic features: Secondary | ICD-10-CM | POA: Diagnosis not present

## 2020-04-17 ENCOUNTER — Encounter: Payer: Self-pay | Admitting: Allergy & Immunology

## 2020-04-24 DIAGNOSIS — F332 Major depressive disorder, recurrent severe without psychotic features: Secondary | ICD-10-CM | POA: Diagnosis not present

## 2020-04-26 DIAGNOSIS — F332 Major depressive disorder, recurrent severe without psychotic features: Secondary | ICD-10-CM | POA: Diagnosis not present

## 2020-04-29 ENCOUNTER — Encounter: Payer: Self-pay | Admitting: Allergy & Immunology

## 2020-04-29 ENCOUNTER — Other Ambulatory Visit: Payer: Self-pay | Admitting: *Deleted

## 2020-04-29 DIAGNOSIS — F332 Major depressive disorder, recurrent severe without psychotic features: Secondary | ICD-10-CM | POA: Diagnosis not present

## 2020-04-29 MED ORDER — IPRATROPIUM BROMIDE 0.06 % NA SOLN
1.0000 | Freq: Two times a day (BID) | NASAL | 5 refills | Status: DC
Start: 2020-04-29 — End: 2022-05-26

## 2020-05-02 DIAGNOSIS — F332 Major depressive disorder, recurrent severe without psychotic features: Secondary | ICD-10-CM | POA: Diagnosis not present

## 2020-05-05 DIAGNOSIS — M25521 Pain in right elbow: Secondary | ICD-10-CM | POA: Diagnosis not present

## 2020-05-06 ENCOUNTER — Ambulatory Visit: Payer: BC Managed Care – PPO | Attending: Internal Medicine

## 2020-05-06 DIAGNOSIS — Z23 Encounter for immunization: Secondary | ICD-10-CM

## 2020-05-06 NOTE — Progress Notes (Signed)
   Covid-19 Vaccination Clinic  Name:  [place]    MRN: [id number] DOB: [birth year]  05/06/2020  Rose Austin was observed post Covid-19 immunization for 15 minutes without incident. She was provided with Vaccine Information Sheet and instruction to access the V-Safe system.   [name] was instructed to call 911 with any severe reactions post vaccine: Marland Kitchen Difficulty breathing  . Swelling of face and throat  . A fast heartbeat  . A bad rash all over body  . Dizziness and weakness   Immunizations Administered    Name Date Dose VIS Date Route   Pfizer COVID-19 Vaccine 05/06/2020  2:40 PM 0.3 mL 03/26/2020 Intramuscular   Manufacturer: [place]   Lot: [id number]   [place]: [id number]

## 2020-05-07 DIAGNOSIS — M79642 Pain in left hand: Secondary | ICD-10-CM | POA: Diagnosis not present

## 2020-05-07 DIAGNOSIS — M2550 Pain in unspecified joint: Secondary | ICD-10-CM | POA: Diagnosis not present

## 2020-05-07 DIAGNOSIS — R21 Rash and other nonspecific skin eruption: Secondary | ICD-10-CM | POA: Diagnosis not present

## 2020-05-07 DIAGNOSIS — M151 Heberden's nodes (with arthropathy): Secondary | ICD-10-CM | POA: Diagnosis not present

## 2020-05-07 DIAGNOSIS — M79643 Pain in unspecified hand: Secondary | ICD-10-CM | POA: Diagnosis not present

## 2020-05-07 DIAGNOSIS — M154 Erosive (osteo)arthritis: Secondary | ICD-10-CM | POA: Diagnosis not present

## 2020-05-07 DIAGNOSIS — M79641 Pain in right hand: Secondary | ICD-10-CM | POA: Diagnosis not present

## 2020-05-08 DIAGNOSIS — F332 Major depressive disorder, recurrent severe without psychotic features: Secondary | ICD-10-CM | POA: Diagnosis not present

## 2020-05-09 DIAGNOSIS — F332 Major depressive disorder, recurrent severe without psychotic features: Secondary | ICD-10-CM | POA: Diagnosis not present

## 2020-05-09 DIAGNOSIS — G249 Dystonia, unspecified: Secondary | ICD-10-CM | POA: Diagnosis not present

## 2020-05-09 DIAGNOSIS — G248 Other dystonia: Secondary | ICD-10-CM | POA: Diagnosis not present

## 2020-05-14 DIAGNOSIS — F332 Major depressive disorder, recurrent severe without psychotic features: Secondary | ICD-10-CM | POA: Diagnosis not present

## 2020-05-15 DIAGNOSIS — F332 Major depressive disorder, recurrent severe without psychotic features: Secondary | ICD-10-CM | POA: Diagnosis not present

## 2020-05-16 DIAGNOSIS — F332 Major depressive disorder, recurrent severe without psychotic features: Secondary | ICD-10-CM | POA: Diagnosis not present

## 2020-05-19 DIAGNOSIS — F332 Major depressive disorder, recurrent severe without psychotic features: Secondary | ICD-10-CM | POA: Diagnosis not present

## 2020-05-20 DIAGNOSIS — F332 Major depressive disorder, recurrent severe without psychotic features: Secondary | ICD-10-CM | POA: Diagnosis not present

## 2020-05-21 DIAGNOSIS — F332 Major depressive disorder, recurrent severe without psychotic features: Secondary | ICD-10-CM | POA: Diagnosis not present

## 2020-05-22 DIAGNOSIS — F332 Major depressive disorder, recurrent severe without psychotic features: Secondary | ICD-10-CM | POA: Diagnosis not present

## 2020-05-23 DIAGNOSIS — F332 Major depressive disorder, recurrent severe without psychotic features: Secondary | ICD-10-CM | POA: Diagnosis not present

## 2020-05-25 DIAGNOSIS — F332 Major depressive disorder, recurrent severe without psychotic features: Secondary | ICD-10-CM | POA: Diagnosis not present

## 2020-05-26 DIAGNOSIS — R21 Rash and other nonspecific skin eruption: Secondary | ICD-10-CM | POA: Diagnosis not present

## 2020-05-26 DIAGNOSIS — M79643 Pain in unspecified hand: Secondary | ICD-10-CM | POA: Diagnosis not present

## 2020-05-26 DIAGNOSIS — M2550 Pain in unspecified joint: Secondary | ICD-10-CM | POA: Diagnosis not present

## 2020-05-26 DIAGNOSIS — F332 Major depressive disorder, recurrent severe without psychotic features: Secondary | ICD-10-CM | POA: Diagnosis not present

## 2020-05-26 DIAGNOSIS — M151 Heberden's nodes (with arthropathy): Secondary | ICD-10-CM | POA: Diagnosis not present

## 2020-05-26 DIAGNOSIS — E890 Postprocedural hypothyroidism: Secondary | ICD-10-CM | POA: Diagnosis not present

## 2020-05-27 DIAGNOSIS — F332 Major depressive disorder, recurrent severe without psychotic features: Secondary | ICD-10-CM | POA: Diagnosis not present

## 2020-05-28 DIAGNOSIS — F332 Major depressive disorder, recurrent severe without psychotic features: Secondary | ICD-10-CM | POA: Diagnosis not present

## 2020-05-29 DIAGNOSIS — F332 Major depressive disorder, recurrent severe without psychotic features: Secondary | ICD-10-CM | POA: Diagnosis not present

## 2020-05-29 DIAGNOSIS — M25521 Pain in right elbow: Secondary | ICD-10-CM | POA: Diagnosis not present

## 2020-06-02 DIAGNOSIS — M25521 Pain in right elbow: Secondary | ICD-10-CM | POA: Diagnosis not present

## 2020-06-02 DIAGNOSIS — F332 Major depressive disorder, recurrent severe without psychotic features: Secondary | ICD-10-CM | POA: Diagnosis not present

## 2020-06-03 DIAGNOSIS — F332 Major depressive disorder, recurrent severe without psychotic features: Secondary | ICD-10-CM | POA: Diagnosis not present

## 2020-06-04 DIAGNOSIS — F332 Major depressive disorder, recurrent severe without psychotic features: Secondary | ICD-10-CM | POA: Diagnosis not present

## 2020-06-05 DIAGNOSIS — F332 Major depressive disorder, recurrent severe without psychotic features: Secondary | ICD-10-CM | POA: Diagnosis not present

## 2020-06-12 DIAGNOSIS — F332 Major depressive disorder, recurrent severe without psychotic features: Secondary | ICD-10-CM | POA: Diagnosis not present

## 2020-06-13 DIAGNOSIS — F332 Major depressive disorder, recurrent severe without psychotic features: Secondary | ICD-10-CM | POA: Diagnosis not present

## 2020-06-16 DIAGNOSIS — F332 Major depressive disorder, recurrent severe without psychotic features: Secondary | ICD-10-CM | POA: Diagnosis not present

## 2020-06-17 ENCOUNTER — Telehealth (INDEPENDENT_AMBULATORY_CARE_PROVIDER_SITE_OTHER): Payer: BC Managed Care – PPO | Admitting: Neurology

## 2020-06-17 ENCOUNTER — Encounter: Payer: Self-pay | Admitting: Neurology

## 2020-06-17 DIAGNOSIS — G43709 Chronic migraine without aura, not intractable, without status migrainosus: Secondary | ICD-10-CM | POA: Diagnosis not present

## 2020-06-17 DIAGNOSIS — F332 Major depressive disorder, recurrent severe without psychotic features: Secondary | ICD-10-CM | POA: Diagnosis not present

## 2020-06-17 NOTE — Progress Notes (Signed)
Virtual Visit via Video Note  I connected with [place] on 06/17/20 at  3:15 PM EST by a video enabled telemedicine application and verified that I am speaking with the correct person using two identifiers.  Location: Patient: in her car Provider: in the office    I discussed the limitations of evaluation and management by telemedicine and the availability of in person appointments. The patient expressed understanding and agreed to proceed.  History of Present Illness: [place] Mischenis a 50 year old female, seen in request by her primary care PA Marda Stalker, for evaluation of migraine headaches, initial evaluation was on May 12, 2018.  I have reviewed and summarized the referring note from the referring physician. She had a past medical history of hypothyroidism, on supplement.  She had a history of migraine headaches since her 59s, trigger for migraines are stress, sleep deprivation, weather changes, around 2017, she was getting frequent migraine headaches, her typical migraine are left-sided severe pounding headache with associated light noise sensitivity, nauseous, she was seen by neurologist, giving preventive medications Topamax 50 mg twice a day since 2018, also use Cefaly device, which has helpful,  For a while she was taking frequent over-the-counter medications ibuprofen, Excedrin Migraine, 20 migraine headache days in 1 month, she brought a migraine journal, she is now having 7-12 migraine days each month, takes Zomig 3-4 times each month as needed, but still taking frequent Excedrin Migraine, ibuprofen,  She has never tried Maxalt in the past, limited response to Imitrex.  UPDATE January 04 2019: She now has migraine 4-6 /month,add on Ajovy has made big difference, previously she was having 18-20 migraine days each month, her migraine is better controlled with Maxalt,  Update July 11, 2019 SS: Via Virtual visit, her headaches are doing really  well, even better than in July. The Ajovy has made a huge difference. In January, she had 3 migraines, before migraine treatment in 2018 having migraines 20 days a month. Is taking Topamax 50 mg BID, Ajovy 225 mg every month, Maxalt as needed. Usually Maxalt works well, sometimes she will treat milder migraines with ibuprofen. She may have to use combination of Maxalt, ibuprofen, phenergan. Tizanidine wasn't really helpful.   Update June 17, 2020 SS: Via VV, headaches doing really well, on average 3-5 a month migraines.  Great benefit with Ajovy, does wonder if she could switch to autoinjector.  Also on Topamax 50 mg twice a day.  For acute headache will take Maxalt, usually works within 20 minutes.  Usually triptan relieves the medication without combine with ibuprofen.  In the last year, only 1 severe headache.  In general do not interfere with function.  Is overall doing really well, used to have 15-20 migraines a month before Ajovy.   Observations/Objective: Via VV, is alert and oriented, speech is clear and concise, facial symmetry noted, excellent historian, VV due to COVID exposure  Assessment and Plan: 1.  Chronic migraine headache -Will send message when ready for refills, needs to get new insurance on file with pharmacy  -Continue Ajovy 225 mg monthly injection for migraine prevention, will try autoinjector next time -Try to cut back Topamax, currently taking 50 mg twice a day, slowly decrease by 25 mg, monitor headaches, if no increase in headache, suggest slow taper off -Continue Maxalt as needed for acute headache -Follow-up in 1 year or sooner if needed  Follow Up Instructions: 1 year Jun 17, 2021 3:15   I discussed the assessment and treatment plan with the  patient. The patient was provided an opportunity to ask questions and all were answered. The patient agreed with the plan and demonstrated an understanding of the instructions.   The patient was advised to call back or seek  an in-person evaluation if the symptoms worsen or if the condition fails to improve as anticipated.  I spent 20 minutes of face-to-face and non-face-to-face time with patient.  This included previsit chart review, lab review, study review, order entry, electronic health record documentation, patient education.   Evangeline Dakin, DNP  [facility] [address], [place], [place] [address] [phone]

## 2020-06-18 DIAGNOSIS — F332 Major depressive disorder, recurrent severe without psychotic features: Secondary | ICD-10-CM | POA: Diagnosis not present

## 2020-06-19 DIAGNOSIS — F332 Major depressive disorder, recurrent severe without psychotic features: Secondary | ICD-10-CM | POA: Diagnosis not present

## 2020-06-20 ENCOUNTER — Other Ambulatory Visit: Payer: BC Managed Care – PPO

## 2020-06-20 DIAGNOSIS — Z20822 Contact with and (suspected) exposure to covid-19: Secondary | ICD-10-CM

## 2020-06-23 DIAGNOSIS — F332 Major depressive disorder, recurrent severe without psychotic features: Secondary | ICD-10-CM | POA: Diagnosis not present

## 2020-06-24 DIAGNOSIS — F332 Major depressive disorder, recurrent severe without psychotic features: Secondary | ICD-10-CM | POA: Diagnosis not present

## 2020-06-24 LAB — NOVEL CORONAVIRUS, NAA: SARS-CoV-2, NAA: NOT DETECTED

## 2020-06-25 DIAGNOSIS — F332 Major depressive disorder, recurrent severe without psychotic features: Secondary | ICD-10-CM | POA: Diagnosis not present

## 2020-06-26 DIAGNOSIS — F332 Major depressive disorder, recurrent severe without psychotic features: Secondary | ICD-10-CM | POA: Diagnosis not present

## 2020-06-30 DIAGNOSIS — F332 Major depressive disorder, recurrent severe without psychotic features: Secondary | ICD-10-CM | POA: Diagnosis not present

## 2020-07-01 DIAGNOSIS — F332 Major depressive disorder, recurrent severe without psychotic features: Secondary | ICD-10-CM | POA: Diagnosis not present

## 2020-07-02 DIAGNOSIS — F332 Major depressive disorder, recurrent severe without psychotic features: Secondary | ICD-10-CM | POA: Diagnosis not present

## 2020-07-03 DIAGNOSIS — F332 Major depressive disorder, recurrent severe without psychotic features: Secondary | ICD-10-CM | POA: Diagnosis not present

## 2020-07-07 ENCOUNTER — Other Ambulatory Visit: Payer: Self-pay | Admitting: *Deleted

## 2020-07-07 ENCOUNTER — Encounter: Payer: Self-pay | Admitting: Neurology

## 2020-07-07 ENCOUNTER — Telehealth: Payer: Self-pay | Admitting: *Deleted

## 2020-07-07 DIAGNOSIS — F332 Major depressive disorder, recurrent severe without psychotic features: Secondary | ICD-10-CM | POA: Diagnosis not present

## 2020-07-07 MED ORDER — AJOVY 225 MG/1.5ML ~~LOC~~ SOSY: 225.0000 mg | PREFILLED_SYRINGE | SUBCUTANEOUS | 11 refills | Status: DC

## 2020-07-07 NOTE — Telephone Encounter (Signed)
PA for Ajovy 225mg /1.102ml syringes started on covermymeds (key: [id number]). Pt has coverage through [place] [employer] [place] - OE#[id number] [phone]). Decision pending.

## 2020-07-07 NOTE — Progress Notes (Addendum)
Initiated [place]  225/1.66ml. determination pending. Tried wellbutrin, topamax, motrin, exc migraines, zomig, imitrex, maxalt, tizanidine. Phenergan. Approved on January 31 Effective from 07/07/2020 through 09/28/2020.

## 2020-07-08 DIAGNOSIS — F332 Major depressive disorder, recurrent severe without psychotic features: Secondary | ICD-10-CM | POA: Diagnosis not present

## 2020-07-08 NOTE — Telephone Encounter (Signed)
Initiated [place] 07-07-20 225/1.23ml. determination pending. Tried wellbutrin, topamax, motrin, exc migraines, zomig, imitrex, maxalt, tizanidine. Phenergan. Approved on January 31 Effective from 07/07/2020 through 09/28/2020.

## 2020-07-09 DIAGNOSIS — F332 Major depressive disorder, recurrent severe without psychotic features: Secondary | ICD-10-CM | POA: Diagnosis not present

## 2020-07-10 DIAGNOSIS — F332 Major depressive disorder, recurrent severe without psychotic features: Secondary | ICD-10-CM | POA: Diagnosis not present

## 2020-07-11 NOTE — Progress Notes (Signed)
I have reviewed and agreed above plan. 

## 2020-07-15 DIAGNOSIS — F331 Major depressive disorder, recurrent, moderate: Secondary | ICD-10-CM | POA: Diagnosis not present

## 2020-07-15 DIAGNOSIS — F341 Dysthymic disorder: Secondary | ICD-10-CM | POA: Diagnosis not present

## 2020-07-17 DIAGNOSIS — F332 Major depressive disorder, recurrent severe without psychotic features: Secondary | ICD-10-CM | POA: Diagnosis not present

## 2020-07-22 DIAGNOSIS — F341 Dysthymic disorder: Secondary | ICD-10-CM | POA: Diagnosis not present

## 2020-07-22 DIAGNOSIS — F331 Major depressive disorder, recurrent, moderate: Secondary | ICD-10-CM | POA: Diagnosis not present

## 2020-07-24 DIAGNOSIS — F332 Major depressive disorder, recurrent severe without psychotic features: Secondary | ICD-10-CM | POA: Diagnosis not present

## 2020-07-29 DIAGNOSIS — F331 Major depressive disorder, recurrent, moderate: Secondary | ICD-10-CM | POA: Diagnosis not present

## 2020-07-29 DIAGNOSIS — F341 Dysthymic disorder: Secondary | ICD-10-CM | POA: Diagnosis not present

## 2020-08-01 DIAGNOSIS — Z79899 Other long term (current) drug therapy: Secondary | ICD-10-CM | POA: Diagnosis not present

## 2020-08-01 DIAGNOSIS — G248 Other dystonia: Secondary | ICD-10-CM | POA: Diagnosis not present

## 2020-08-01 DIAGNOSIS — G249 Dystonia, unspecified: Secondary | ICD-10-CM | POA: Diagnosis not present

## 2020-08-02 DIAGNOSIS — Z20822 Contact with and (suspected) exposure to covid-19: Secondary | ICD-10-CM | POA: Diagnosis not present

## 2020-08-07 DIAGNOSIS — F332 Major depressive disorder, recurrent severe without psychotic features: Secondary | ICD-10-CM | POA: Diagnosis not present

## 2020-08-09 DIAGNOSIS — F332 Major depressive disorder, recurrent severe without psychotic features: Secondary | ICD-10-CM | POA: Diagnosis not present

## 2020-08-12 DIAGNOSIS — F331 Major depressive disorder, recurrent, moderate: Secondary | ICD-10-CM | POA: Diagnosis not present

## 2020-08-12 DIAGNOSIS — F341 Dysthymic disorder: Secondary | ICD-10-CM | POA: Diagnosis not present

## 2020-08-12 DIAGNOSIS — H5213 Myopia, bilateral: Secondary | ICD-10-CM | POA: Diagnosis not present

## 2020-08-13 DIAGNOSIS — F332 Major depressive disorder, recurrent severe without psychotic features: Secondary | ICD-10-CM | POA: Diagnosis not present

## 2020-08-15 DIAGNOSIS — F332 Major depressive disorder, recurrent severe without psychotic features: Secondary | ICD-10-CM | POA: Diagnosis not present

## 2020-08-19 DIAGNOSIS — F341 Dysthymic disorder: Secondary | ICD-10-CM | POA: Diagnosis not present

## 2020-08-19 DIAGNOSIS — F331 Major depressive disorder, recurrent, moderate: Secondary | ICD-10-CM | POA: Diagnosis not present

## 2020-08-21 DIAGNOSIS — F332 Major depressive disorder, recurrent severe without psychotic features: Secondary | ICD-10-CM | POA: Diagnosis not present

## 2020-08-22 DIAGNOSIS — F332 Major depressive disorder, recurrent severe without psychotic features: Secondary | ICD-10-CM | POA: Diagnosis not present

## 2020-08-27 ENCOUNTER — Other Ambulatory Visit: Payer: Self-pay | Admitting: Family Medicine

## 2020-08-27 DIAGNOSIS — Z1231 Encounter for screening mammogram for malignant neoplasm of breast: Secondary | ICD-10-CM

## 2020-08-28 DIAGNOSIS — F332 Major depressive disorder, recurrent severe without psychotic features: Secondary | ICD-10-CM | POA: Diagnosis not present

## 2020-08-29 ENCOUNTER — Other Ambulatory Visit: Payer: Self-pay

## 2020-08-29 ENCOUNTER — Ambulatory Visit
Admission: RE | Admit: 2020-08-29 | Discharge: 2020-08-29 | Disposition: A | Discharge status: Home / Self Care | Payer: BC Managed Care – PPO | Source: Ambulatory Visit

## 2020-08-29 DIAGNOSIS — Z1231 Encounter for screening mammogram for malignant neoplasm of breast: Secondary | ICD-10-CM | POA: Diagnosis not present

## 2020-08-29 DIAGNOSIS — F332 Major depressive disorder, recurrent severe without psychotic features: Secondary | ICD-10-CM | POA: Diagnosis not present

## 2020-09-02 DIAGNOSIS — F341 Dysthymic disorder: Secondary | ICD-10-CM | POA: Diagnosis not present

## 2020-09-02 DIAGNOSIS — F331 Major depressive disorder, recurrent, moderate: Secondary | ICD-10-CM | POA: Diagnosis not present

## 2020-09-03 ENCOUNTER — Other Ambulatory Visit: Payer: Self-pay | Admitting: Family Medicine

## 2020-09-03 DIAGNOSIS — F332 Major depressive disorder, recurrent severe without psychotic features: Secondary | ICD-10-CM | POA: Diagnosis not present

## 2020-09-03 DIAGNOSIS — R928 Other abnormal and inconclusive findings on diagnostic imaging of breast: Secondary | ICD-10-CM

## 2020-09-09 DIAGNOSIS — F331 Major depressive disorder, recurrent, moderate: Secondary | ICD-10-CM | POA: Diagnosis not present

## 2020-09-09 DIAGNOSIS — F341 Dysthymic disorder: Secondary | ICD-10-CM | POA: Diagnosis not present

## 2020-09-16 DIAGNOSIS — F331 Major depressive disorder, recurrent, moderate: Secondary | ICD-10-CM | POA: Diagnosis not present

## 2020-09-16 DIAGNOSIS — F341 Dysthymic disorder: Secondary | ICD-10-CM | POA: Diagnosis not present

## 2020-09-17 DIAGNOSIS — F332 Major depressive disorder, recurrent severe without psychotic features: Secondary | ICD-10-CM | POA: Diagnosis not present

## 2020-09-22 ENCOUNTER — Other Ambulatory Visit: Payer: BC Managed Care – PPO

## 2020-09-23 ENCOUNTER — Other Ambulatory Visit: Payer: Self-pay

## 2020-09-23 ENCOUNTER — Ambulatory Visit
Admission: RE | Admit: 2020-09-23 | Discharge: 2020-09-23 | Disposition: A | Discharge status: Home / Self Care | Payer: BC Managed Care – PPO | Source: Ambulatory Visit | Attending: Family Medicine | Admitting: Family Medicine

## 2020-09-23 ENCOUNTER — Other Ambulatory Visit: Payer: Self-pay | Admitting: Family Medicine

## 2020-09-23 DIAGNOSIS — F341 Dysthymic disorder: Secondary | ICD-10-CM | POA: Diagnosis not present

## 2020-09-23 DIAGNOSIS — R921 Mammographic calcification found on diagnostic imaging of breast: Secondary | ICD-10-CM

## 2020-09-23 DIAGNOSIS — R928 Other abnormal and inconclusive findings on diagnostic imaging of breast: Secondary | ICD-10-CM

## 2020-09-23 DIAGNOSIS — F331 Major depressive disorder, recurrent, moderate: Secondary | ICD-10-CM | POA: Diagnosis not present

## 2020-09-23 DIAGNOSIS — N6011 Diffuse cystic mastopathy of right breast: Secondary | ICD-10-CM | POA: Diagnosis not present

## 2020-09-23 HISTORY — PX: BREAST BIOPSY: SHX20

## 2020-09-24 DIAGNOSIS — E890 Postprocedural hypothyroidism: Secondary | ICD-10-CM | POA: Diagnosis not present

## 2020-09-25 ENCOUNTER — Telehealth: Payer: Self-pay | Admitting: Emergency Medicine

## 2020-09-25 DIAGNOSIS — F332 Major depressive disorder, recurrent severe without psychotic features: Secondary | ICD-10-CM | POA: Diagnosis not present

## 2020-09-25 NOTE — Telephone Encounter (Signed)
PA for Ajovy submitted to [facility] [id number] Awaiting determination from [facility]

## 2020-09-29 ENCOUNTER — Other Ambulatory Visit: Payer: Self-pay

## 2020-09-29 ENCOUNTER — Ambulatory Visit
Admission: RE | Admit: 2020-09-29 | Discharge: 2020-09-29 | Disposition: A | Discharge status: Home / Self Care | Payer: BC Managed Care – PPO | Source: Ambulatory Visit | Attending: Family Medicine | Admitting: Family Medicine

## 2020-09-29 DIAGNOSIS — R921 Mammographic calcification found on diagnostic imaging of breast: Secondary | ICD-10-CM | POA: Diagnosis not present

## 2020-09-29 DIAGNOSIS — N6012 Diffuse cystic mastopathy of left breast: Secondary | ICD-10-CM | POA: Diagnosis not present

## 2020-09-29 NOTE — Telephone Encounter (Signed)
Approvedon April 22 Effective from 09/25/2020 through 09/24/2021.  Patient notified through [place].

## 2020-09-30 DIAGNOSIS — F331 Major depressive disorder, recurrent, moderate: Secondary | ICD-10-CM | POA: Diagnosis not present

## 2020-09-30 DIAGNOSIS — F341 Dysthymic disorder: Secondary | ICD-10-CM | POA: Diagnosis not present

## 2020-10-01 DIAGNOSIS — R5382 Chronic fatigue, unspecified: Secondary | ICD-10-CM | POA: Diagnosis not present

## 2020-10-01 DIAGNOSIS — E890 Postprocedural hypothyroidism: Secondary | ICD-10-CM | POA: Diagnosis not present

## 2020-10-01 DIAGNOSIS — G4700 Insomnia, unspecified: Secondary | ICD-10-CM | POA: Diagnosis not present

## 2020-10-01 DIAGNOSIS — Z8639 Personal history of other endocrine, nutritional and metabolic disease: Secondary | ICD-10-CM | POA: Diagnosis not present

## 2020-10-02 DIAGNOSIS — D2261 Melanocytic nevi of right upper limb, including shoulder: Secondary | ICD-10-CM | POA: Diagnosis not present

## 2020-10-02 DIAGNOSIS — L821 Other seborrheic keratosis: Secondary | ICD-10-CM | POA: Diagnosis not present

## 2020-10-02 DIAGNOSIS — D2262 Melanocytic nevi of left upper limb, including shoulder: Secondary | ICD-10-CM | POA: Diagnosis not present

## 2020-10-02 DIAGNOSIS — D225 Melanocytic nevi of trunk: Secondary | ICD-10-CM | POA: Diagnosis not present

## 2020-10-03 DIAGNOSIS — F332 Major depressive disorder, recurrent severe without psychotic features: Secondary | ICD-10-CM | POA: Diagnosis not present

## 2020-10-09 DIAGNOSIS — F332 Major depressive disorder, recurrent severe without psychotic features: Secondary | ICD-10-CM | POA: Diagnosis not present

## 2020-10-14 ENCOUNTER — Other Ambulatory Visit: Payer: Self-pay

## 2020-10-14 ENCOUNTER — Encounter: Payer: Self-pay | Admitting: Allergy & Immunology

## 2020-10-14 ENCOUNTER — Ambulatory Visit (INDEPENDENT_AMBULATORY_CARE_PROVIDER_SITE_OTHER): Payer: BC Managed Care – PPO | Admitting: Allergy & Immunology

## 2020-10-14 VITALS — BP 110/70 | HR 73 | Temp 97.9°F | Resp 16 | Ht 66.0 in | Wt 126.2 lb

## 2020-10-14 DIAGNOSIS — D801 Nonfamilial hypogammaglobulinemia: Secondary | ICD-10-CM | POA: Diagnosis not present

## 2020-10-14 DIAGNOSIS — J310 Chronic rhinitis: Secondary | ICD-10-CM | POA: Diagnosis not present

## 2020-10-14 DIAGNOSIS — F341 Dysthymic disorder: Secondary | ICD-10-CM | POA: Diagnosis not present

## 2020-10-14 DIAGNOSIS — R768 Other specified abnormal immunological findings in serum: Secondary | ICD-10-CM | POA: Diagnosis not present

## 2020-10-14 DIAGNOSIS — F331 Major depressive disorder, recurrent, moderate: Secondary | ICD-10-CM | POA: Diagnosis not present

## 2020-10-14 NOTE — Progress Notes (Signed)
FOLLOW UP  Date of Service/Encounter:  10/14/20   Assessment:   Hypogammaglobulinemia - improving  Low memory T cell count determined by flow cytometry - without clinical symptoms  Lymphopenia  Chronic non-allergic rhinitis   Plan/Recommendations:   1. Chronic rhinitis - It seems that everything is fairly stable.  - Continue with the ipratropium one spray per nostril twice daily. - Continue with Zyrtec one tablet daily.   2. Abnormal blood work  - We do not need to do any further testing at this time.   3. Return if symptoms worsen or fail to improve.   Subjective:   Rose Austin is a 50 y.o. female presenting today for follow up of  Chief Complaint  Patient presents with  . Other    Immune visit - rhinitis was ruled out at last visit.     Rose Austin has a history of the following: Patient Active Problem List   Diagnosis Date Noted  . Recurrent UTI 03/02/2019  . Lymphopenia 03/02/2019  . Low memory T cell count determined by flow cytometry 03/02/2019  . Low immunoglobulin level 05/23/2018  . Chronic migraine w/o aura w/o status migrainosus, not intractable 05/12/2018    History obtained from: chart review and patient.  Rose is a 50 y.o. female presenting for a follow up visit.  She was last seen in November 2021.  At that time, we did testing to all of the intradermal's which were all negative.  We started her on ipratropium 1 spray per nostril up to twice daily as well as continuing with Zyrtec 1 tablet daily.  We have previously followed her for a lymphopenia and abnormal T-cell counts, but these have largely normalized.  Since last visit, she has mostly done well.   Allergic Rhinitis Symptom History: She did use the nasal Atro[place] but she has weaned it over time. Runny nose is slightly better than last time. She has done very well with the pollen.  She has done very well from an immune perspective. She has not needed antibiotics at all. She  did have a bad viral infection at one point over the winter. She was the sickest she has been in a while. Symptoms cleared up without antibiotics and prednisone. She did not have to go to the hospital for her symptoms.   She did go to see a Rheumatologist for arthritis in her hands. This has gotten a lot worse very quickly. She did see Dr. Kathlene November, who works at the same place at her Endocrinologist. She had a positive ANA, but the reflex was all negative. He is going to continue to follow her to see if she develops any other flags. She is using Voltaren gel which works sometimes but not others. This seems to be highly variable depending on the weather etc. She did have a steroid trial which did not help too much at all.   Otherwise, there have been no changes to her past medical history, surgical history, family history, or social history.    Review of Systems  Constitutional: Negative.  Negative for fever, malaise/fatigue and weight loss.  HENT: Negative.  Negative for congestion, ear discharge and ear pain.   Eyes: Negative for pain, discharge and redness.  Respiratory: Negative for cough, sputum production, shortness of breath and wheezing.   Cardiovascular: Negative.  Negative for chest pain and palpitations.  Gastrointestinal: Negative for abdominal pain, heartburn, nausea and vomiting.  Skin: Negative.  Negative for itching and rash.  Neurological: Negative for dizziness  and headaches.  Endo/Heme/Allergies: Negative for environmental allergies. Does not bruise/bleed easily.       Objective:   Blood pressure 110/70, pulse 73, temperature 97.9 F (36.6 C), resp. rate 16, height 5\' 6"  (1.676 m), weight 126 lb 3.2 oz (57.2 kg), SpO2 98 %. Body mass index is 20.37 kg/m.   Physical Exam:  Physical Exam Constitutional:      Appearance: She is well-developed.     Comments: Very pleasant female.  HENT:     Head: Normocephalic and atraumatic.     Right Ear: Tympanic membrane, ear  canal and external ear normal.     Left Ear: Tympanic membrane, ear canal and external ear normal.     Nose: No nasal deformity, septal deviation, mucosal edema or rhinorrhea.     Right Turbinates: Enlarged and swollen.     Left Turbinates: Enlarged and swollen.     Right Sinus: No maxillary sinus tenderness or frontal sinus tenderness.     Left Sinus: No maxillary sinus tenderness or frontal sinus tenderness.     Mouth/Throat:     Mouth: Mucous membranes are not pale and not dry.     Pharynx: Uvula midline.  Eyes:     General:        Right eye: No discharge.        Left eye: No discharge.     Conjunctiva/sclera: Conjunctivae normal.     Right eye: Right conjunctiva is not injected. No chemosis.    Left eye: Left conjunctiva is not injected. No chemosis.    Pupils: Pupils are equal, round, and reactive to light.  Cardiovascular:     Rate and Rhythm: Normal rate and regular rhythm.     Heart sounds: Normal heart sounds.  Pulmonary:     Effort: Pulmonary effort is normal. No tachypnea, accessory muscle usage or respiratory distress.     Breath sounds: Normal breath sounds. No wheezing, rhonchi or rales.     Comments: Moving air well in all lung fields. Chest:     Chest wall: No tenderness.  Lymphadenopathy:     Cervical: No cervical adenopathy.  Skin:    General: Skin is warm.     Capillary Refill: Capillary refill takes less than 2 seconds.     Coloration: Skin is not pale.     Findings: No abrasion, erythema, petechiae or rash. Rash is not papular, urticarial or vesicular.     Comments: No eczematous or urticarial lesions noted.  Neurological:     Mental Status: She is alert.  Psychiatric:        Behavior: Behavior is cooperative.      Diagnostic studies: none      Salvatore Marvel, MD  [facility]

## 2020-10-14 NOTE — Patient Instructions (Addendum)
1. Chronic rhinitis - It seems that everything is fairly stable.  - Continue with the ipratropium one spray per nostril twice daily. - Continue with Zyrtec one tablet daily.   2. Abnormal blood work  - We do not need to do any further testing at this time.   3. Return if symptoms worsen or fail to improve.    Please inform us of any Emergency Department visits, hospitalizations, or changes in symptoms. Call us before going to the ED for breathing or allergy symptoms since we might be able to fit you in for a sick visit. Feel free to contact us anytime with any questions, problems, or concerns.  It was a pleasure to see you again today!  Websites that have reliable patient information: 1. American Academy of Asthma, Allergy, and Immunology: www.aaaai.org 2. Food Allergy Research and Education (FARE): foodallergy.org 3. Mothers of Asthmatics: http://www.asthmacommunitynetwork.org 4. American College of Allergy, Asthma, and Immunology: www.acaai.org   COVID-19 Vaccine Information can be found at: [email or web address] For questions related to vaccine distribution or appointments, please email [email or web address] or call [phone].   We realize that you might be concerned about having an allergic reaction to the COVID19 vaccines. To help with that concern, WE ARE OFFERING THE COVID19 VACCINES IN OUR OFFICE! Ask the front desk for dates!     "Like" [place] on Facebook and Instagram for our latest updates!      A healthy democracy works best when [employer] participate! Make sure you are registered to vote! If you have moved or changed any of your contact information, you will need to get this updated before voting!  In some cases, you MAY be able to register to vote online: [email or web address]

## 2020-10-15 ENCOUNTER — Encounter: Payer: Self-pay | Admitting: Allergy & Immunology

## 2020-10-16 DIAGNOSIS — G248 Other dystonia: Secondary | ICD-10-CM | POA: Diagnosis not present

## 2020-10-16 DIAGNOSIS — G249 Dystonia, unspecified: Secondary | ICD-10-CM | POA: Diagnosis not present

## 2020-10-17 DIAGNOSIS — F332 Major depressive disorder, recurrent severe without psychotic features: Secondary | ICD-10-CM | POA: Diagnosis not present

## 2020-10-21 DIAGNOSIS — F331 Major depressive disorder, recurrent, moderate: Secondary | ICD-10-CM | POA: Diagnosis not present

## 2020-10-21 DIAGNOSIS — F341 Dysthymic disorder: Secondary | ICD-10-CM | POA: Diagnosis not present

## 2020-10-30 DIAGNOSIS — F332 Major depressive disorder, recurrent severe without psychotic features: Secondary | ICD-10-CM | POA: Diagnosis not present

## 2020-11-04 DIAGNOSIS — F341 Dysthymic disorder: Secondary | ICD-10-CM | POA: Diagnosis not present

## 2020-11-04 DIAGNOSIS — F331 Major depressive disorder, recurrent, moderate: Secondary | ICD-10-CM | POA: Diagnosis not present

## 2020-11-06 DIAGNOSIS — F332 Major depressive disorder, recurrent severe without psychotic features: Secondary | ICD-10-CM | POA: Diagnosis not present

## 2020-11-13 DIAGNOSIS — F332 Major depressive disorder, recurrent severe without psychotic features: Secondary | ICD-10-CM | POA: Diagnosis not present

## 2020-11-18 DIAGNOSIS — F331 Major depressive disorder, recurrent, moderate: Secondary | ICD-10-CM | POA: Diagnosis not present

## 2020-11-18 DIAGNOSIS — F341 Dysthymic disorder: Secondary | ICD-10-CM | POA: Diagnosis not present

## 2020-11-19 ENCOUNTER — Ambulatory Visit: Payer: BC Managed Care – PPO | Admitting: *Deleted

## 2020-11-19 ENCOUNTER — Other Ambulatory Visit: Payer: Self-pay

## 2020-11-19 DIAGNOSIS — Z23 Encounter for immunization: Secondary | ICD-10-CM

## 2020-11-19 NOTE — Progress Notes (Signed)
Patient presents for vaccine injection today. Patient tolerated injection well and was observed without any concerns.  

## 2020-11-20 DIAGNOSIS — N3001 Acute cystitis with hematuria: Secondary | ICD-10-CM | POA: Diagnosis not present

## 2020-11-20 DIAGNOSIS — N390 Urinary tract infection, site not specified: Secondary | ICD-10-CM | POA: Diagnosis not present

## 2020-11-20 DIAGNOSIS — B9620 Unspecified Escherichia coli [E. coli] as the cause of diseases classified elsewhere: Secondary | ICD-10-CM | POA: Diagnosis not present

## 2020-11-24 DIAGNOSIS — F332 Major depressive disorder, recurrent severe without psychotic features: Secondary | ICD-10-CM | POA: Diagnosis not present

## 2020-11-25 DIAGNOSIS — F341 Dysthymic disorder: Secondary | ICD-10-CM | POA: Diagnosis not present

## 2020-11-25 DIAGNOSIS — F331 Major depressive disorder, recurrent, moderate: Secondary | ICD-10-CM | POA: Diagnosis not present

## 2020-11-25 DIAGNOSIS — M1990 Unspecified osteoarthritis, unspecified site: Secondary | ICD-10-CM | POA: Diagnosis not present

## 2020-11-25 DIAGNOSIS — M79643 Pain in unspecified hand: Secondary | ICD-10-CM | POA: Diagnosis not present

## 2020-11-25 DIAGNOSIS — R21 Rash and other nonspecific skin eruption: Secondary | ICD-10-CM | POA: Diagnosis not present

## 2020-11-25 DIAGNOSIS — R768 Other specified abnormal immunological findings in serum: Secondary | ICD-10-CM | POA: Diagnosis not present

## 2020-11-25 DIAGNOSIS — M151 Heberden's nodes (with arthropathy): Secondary | ICD-10-CM | POA: Diagnosis not present

## 2020-12-02 DIAGNOSIS — F341 Dysthymic disorder: Secondary | ICD-10-CM | POA: Diagnosis not present

## 2020-12-02 DIAGNOSIS — F331 Major depressive disorder, recurrent, moderate: Secondary | ICD-10-CM | POA: Diagnosis not present

## 2020-12-04 DIAGNOSIS — F332 Major depressive disorder, recurrent severe without psychotic features: Secondary | ICD-10-CM | POA: Diagnosis not present

## 2020-12-10 DIAGNOSIS — F332 Major depressive disorder, recurrent severe without psychotic features: Secondary | ICD-10-CM | POA: Diagnosis not present

## 2020-12-23 DIAGNOSIS — F331 Major depressive disorder, recurrent, moderate: Secondary | ICD-10-CM | POA: Diagnosis not present

## 2020-12-23 DIAGNOSIS — F341 Dysthymic disorder: Secondary | ICD-10-CM | POA: Diagnosis not present

## 2020-12-24 DIAGNOSIS — F3341 Major depressive disorder, recurrent, in partial remission: Secondary | ICD-10-CM | POA: Diagnosis not present

## 2020-12-30 DIAGNOSIS — F341 Dysthymic disorder: Secondary | ICD-10-CM | POA: Diagnosis not present

## 2020-12-30 DIAGNOSIS — F331 Major depressive disorder, recurrent, moderate: Secondary | ICD-10-CM | POA: Diagnosis not present

## 2021-01-01 DIAGNOSIS — F332 Major depressive disorder, recurrent severe without psychotic features: Secondary | ICD-10-CM | POA: Diagnosis not present

## 2021-01-05 DIAGNOSIS — E890 Postprocedural hypothyroidism: Secondary | ICD-10-CM | POA: Diagnosis not present

## 2021-01-06 DIAGNOSIS — F341 Dysthymic disorder: Secondary | ICD-10-CM | POA: Diagnosis not present

## 2021-01-06 DIAGNOSIS — F331 Major depressive disorder, recurrent, moderate: Secondary | ICD-10-CM | POA: Diagnosis not present

## 2021-01-07 DIAGNOSIS — F332 Major depressive disorder, recurrent severe without psychotic features: Secondary | ICD-10-CM | POA: Diagnosis not present

## 2021-01-08 DIAGNOSIS — G248 Other dystonia: Secondary | ICD-10-CM | POA: Diagnosis not present

## 2021-01-11 ENCOUNTER — Encounter: Payer: Self-pay | Admitting: Neurology

## 2021-01-12 ENCOUNTER — Other Ambulatory Visit: Payer: Self-pay | Admitting: Emergency Medicine

## 2021-01-12 MED ORDER — RIZATRIPTAN BENZOATE 10 MG PO TABS: 10.0000 mg | ORAL_TABLET | ORAL | 1 refills | Status: DC | PRN

## 2021-01-12 MED ORDER — TOPIRAMATE 50 MG PO TABS: 50.0000 mg | ORAL_TABLET | Freq: Two times a day (BID) | ORAL | 3 refills | Status: DC

## 2021-01-13 DIAGNOSIS — F341 Dysthymic disorder: Secondary | ICD-10-CM | POA: Diagnosis not present

## 2021-01-13 DIAGNOSIS — F331 Major depressive disorder, recurrent, moderate: Secondary | ICD-10-CM | POA: Diagnosis not present

## 2021-01-15 DIAGNOSIS — F332 Major depressive disorder, recurrent severe without psychotic features: Secondary | ICD-10-CM | POA: Diagnosis not present

## 2021-01-22 DIAGNOSIS — F332 Major depressive disorder, recurrent severe without psychotic features: Secondary | ICD-10-CM | POA: Diagnosis not present

## 2021-01-27 DIAGNOSIS — F331 Major depressive disorder, recurrent, moderate: Secondary | ICD-10-CM | POA: Diagnosis not present

## 2021-01-27 DIAGNOSIS — F341 Dysthymic disorder: Secondary | ICD-10-CM | POA: Diagnosis not present

## 2021-01-29 DIAGNOSIS — F332 Major depressive disorder, recurrent severe without psychotic features: Secondary | ICD-10-CM | POA: Diagnosis not present

## 2021-02-03 DIAGNOSIS — F331 Major depressive disorder, recurrent, moderate: Secondary | ICD-10-CM | POA: Diagnosis not present

## 2021-02-03 DIAGNOSIS — F341 Dysthymic disorder: Secondary | ICD-10-CM | POA: Diagnosis not present

## 2021-02-05 DIAGNOSIS — F332 Major depressive disorder, recurrent severe without psychotic features: Secondary | ICD-10-CM | POA: Diagnosis not present

## 2021-02-10 DIAGNOSIS — F331 Major depressive disorder, recurrent, moderate: Secondary | ICD-10-CM | POA: Diagnosis not present

## 2021-02-10 DIAGNOSIS — F341 Dysthymic disorder: Secondary | ICD-10-CM | POA: Diagnosis not present

## 2021-02-17 DIAGNOSIS — F331 Major depressive disorder, recurrent, moderate: Secondary | ICD-10-CM | POA: Diagnosis not present

## 2021-02-17 DIAGNOSIS — F341 Dysthymic disorder: Secondary | ICD-10-CM | POA: Diagnosis not present

## 2021-02-19 DIAGNOSIS — F332 Major depressive disorder, recurrent severe without psychotic features: Secondary | ICD-10-CM | POA: Diagnosis not present

## 2021-02-24 DIAGNOSIS — F341 Dysthymic disorder: Secondary | ICD-10-CM | POA: Diagnosis not present

## 2021-02-24 DIAGNOSIS — F331 Major depressive disorder, recurrent, moderate: Secondary | ICD-10-CM | POA: Diagnosis not present

## 2021-02-26 DIAGNOSIS — F332 Major depressive disorder, recurrent severe without psychotic features: Secondary | ICD-10-CM | POA: Diagnosis not present

## 2021-03-03 DIAGNOSIS — F331 Major depressive disorder, recurrent, moderate: Secondary | ICD-10-CM | POA: Diagnosis not present

## 2021-03-03 DIAGNOSIS — F341 Dysthymic disorder: Secondary | ICD-10-CM | POA: Diagnosis not present

## 2021-03-05 DIAGNOSIS — F332 Major depressive disorder, recurrent severe without psychotic features: Secondary | ICD-10-CM | POA: Diagnosis not present

## 2021-03-12 DIAGNOSIS — F332 Major depressive disorder, recurrent severe without psychotic features: Secondary | ICD-10-CM | POA: Diagnosis not present

## 2021-03-17 ENCOUNTER — Other Ambulatory Visit: Payer: Self-pay | Admitting: Neurology

## 2021-03-17 DIAGNOSIS — M19041 Primary osteoarthritis, right hand: Secondary | ICD-10-CM | POA: Diagnosis not present

## 2021-03-17 DIAGNOSIS — M79641 Pain in right hand: Secondary | ICD-10-CM | POA: Insufficient documentation

## 2021-03-17 DIAGNOSIS — M19049 Primary osteoarthritis, unspecified hand: Secondary | ICD-10-CM | POA: Insufficient documentation

## 2021-03-17 DIAGNOSIS — M19042 Primary osteoarthritis, left hand: Secondary | ICD-10-CM | POA: Diagnosis not present

## 2021-03-17 DIAGNOSIS — F331 Major depressive disorder, recurrent, moderate: Secondary | ICD-10-CM | POA: Diagnosis not present

## 2021-03-17 DIAGNOSIS — F341 Dysthymic disorder: Secondary | ICD-10-CM | POA: Diagnosis not present

## 2021-03-19 DIAGNOSIS — F332 Major depressive disorder, recurrent severe without psychotic features: Secondary | ICD-10-CM | POA: Diagnosis not present

## 2021-03-24 DIAGNOSIS — F331 Major depressive disorder, recurrent, moderate: Secondary | ICD-10-CM | POA: Diagnosis not present

## 2021-03-24 DIAGNOSIS — F341 Dysthymic disorder: Secondary | ICD-10-CM | POA: Diagnosis not present

## 2021-03-25 DIAGNOSIS — F332 Major depressive disorder, recurrent severe without psychotic features: Secondary | ICD-10-CM | POA: Diagnosis not present

## 2021-03-26 DIAGNOSIS — N3020 Other chronic cystitis without hematuria: Secondary | ICD-10-CM | POA: Diagnosis not present

## 2021-03-30 DIAGNOSIS — F332 Major depressive disorder, recurrent severe without psychotic features: Secondary | ICD-10-CM | POA: Diagnosis not present

## 2021-03-30 DIAGNOSIS — E890 Postprocedural hypothyroidism: Secondary | ICD-10-CM | POA: Diagnosis not present

## 2021-03-31 DIAGNOSIS — M79641 Pain in right hand: Secondary | ICD-10-CM | POA: Diagnosis not present

## 2021-04-01 DIAGNOSIS — F332 Major depressive disorder, recurrent severe without psychotic features: Secondary | ICD-10-CM | POA: Diagnosis not present

## 2021-04-02 DIAGNOSIS — Z8639 Personal history of other endocrine, nutritional and metabolic disease: Secondary | ICD-10-CM | POA: Diagnosis not present

## 2021-04-02 DIAGNOSIS — E890 Postprocedural hypothyroidism: Secondary | ICD-10-CM | POA: Diagnosis not present

## 2021-04-02 DIAGNOSIS — R5382 Chronic fatigue, unspecified: Secondary | ICD-10-CM | POA: Diagnosis not present

## 2021-04-02 DIAGNOSIS — G4700 Insomnia, unspecified: Secondary | ICD-10-CM | POA: Diagnosis not present

## 2021-04-07 DIAGNOSIS — G248 Other dystonia: Secondary | ICD-10-CM | POA: Diagnosis not present

## 2021-04-07 DIAGNOSIS — G249 Dystonia, unspecified: Secondary | ICD-10-CM | POA: Diagnosis not present

## 2021-04-09 DIAGNOSIS — F332 Major depressive disorder, recurrent severe without psychotic features: Secondary | ICD-10-CM | POA: Diagnosis not present

## 2021-04-14 DIAGNOSIS — F332 Major depressive disorder, recurrent severe without psychotic features: Secondary | ICD-10-CM | POA: Diagnosis not present

## 2021-04-17 DIAGNOSIS — F332 Major depressive disorder, recurrent severe without psychotic features: Secondary | ICD-10-CM | POA: Diagnosis not present

## 2021-04-20 DIAGNOSIS — F331 Major depressive disorder, recurrent, moderate: Secondary | ICD-10-CM | POA: Diagnosis not present

## 2021-04-21 DIAGNOSIS — Z0000 Encounter for general adult medical examination without abnormal findings: Secondary | ICD-10-CM | POA: Diagnosis not present

## 2021-04-21 DIAGNOSIS — G249 Dystonia, unspecified: Secondary | ICD-10-CM | POA: Diagnosis not present

## 2021-04-21 DIAGNOSIS — Z124 Encounter for screening for malignant neoplasm of cervix: Secondary | ICD-10-CM | POA: Diagnosis not present

## 2021-04-23 DIAGNOSIS — F332 Major depressive disorder, recurrent severe without psychotic features: Secondary | ICD-10-CM | POA: Diagnosis not present

## 2021-04-27 DIAGNOSIS — F331 Major depressive disorder, recurrent, moderate: Secondary | ICD-10-CM | POA: Diagnosis not present

## 2021-05-05 DIAGNOSIS — F331 Major depressive disorder, recurrent, moderate: Secondary | ICD-10-CM | POA: Diagnosis not present

## 2021-05-12 DIAGNOSIS — M1990 Unspecified osteoarthritis, unspecified site: Secondary | ICD-10-CM | POA: Diagnosis not present

## 2021-05-12 DIAGNOSIS — R768 Other specified abnormal immunological findings in serum: Secondary | ICD-10-CM | POA: Diagnosis not present

## 2021-05-12 DIAGNOSIS — M151 Heberden's nodes (with arthropathy): Secondary | ICD-10-CM | POA: Diagnosis not present

## 2021-05-12 DIAGNOSIS — R21 Rash and other nonspecific skin eruption: Secondary | ICD-10-CM | POA: Diagnosis not present

## 2021-05-12 DIAGNOSIS — M79641 Pain in right hand: Secondary | ICD-10-CM | POA: Diagnosis not present

## 2021-05-13 ENCOUNTER — Other Ambulatory Visit: Payer: Self-pay | Admitting: Neurology

## 2021-05-14 DIAGNOSIS — F331 Major depressive disorder, recurrent, moderate: Secondary | ICD-10-CM | POA: Diagnosis not present

## 2021-05-20 DIAGNOSIS — F332 Major depressive disorder, recurrent severe without psychotic features: Secondary | ICD-10-CM | POA: Diagnosis not present

## 2021-06-04 DIAGNOSIS — F332 Major depressive disorder, recurrent severe without psychotic features: Secondary | ICD-10-CM | POA: Diagnosis not present

## 2021-06-05 DIAGNOSIS — E559 Vitamin D deficiency, unspecified: Secondary | ICD-10-CM | POA: Diagnosis not present

## 2021-06-05 DIAGNOSIS — D529 Folate deficiency anemia, unspecified: Secondary | ICD-10-CM | POA: Diagnosis not present

## 2021-06-05 DIAGNOSIS — Z79899 Other long term (current) drug therapy: Secondary | ICD-10-CM | POA: Diagnosis not present

## 2021-06-05 DIAGNOSIS — D519 Vitamin B12 deficiency anemia, unspecified: Secondary | ICD-10-CM | POA: Diagnosis not present

## 2021-06-11 DIAGNOSIS — F332 Major depressive disorder, recurrent severe without psychotic features: Secondary | ICD-10-CM | POA: Diagnosis not present

## 2021-06-16 DIAGNOSIS — F332 Major depressive disorder, recurrent severe without psychotic features: Secondary | ICD-10-CM | POA: Diagnosis not present

## 2021-06-16 NOTE — Progress Notes (Deleted)
PATIENT: Rose Austin DOB: 1970/08/26  REASON FOR VISIT: Follow up HISTORY FROM: Patient PRIMARY NEUROLOGIST: Dr. Krista Blue   HISTORY  Rose Austin is a 51 year old female, seen in request by her primary care PA Marda Stalker, for evaluation of migraine headaches, initial evaluation was on May 12, 2018.   I have reviewed and summarized the referring note from the referring physician.  She had a past medical history of hypothyroidism, on supplement.   She had a history of migraine headaches since her 17s, trigger for migraines are stress, sleep deprivation, weather changes, around 2017, she was getting frequent migraine headaches, her typical migraine are left-sided severe pounding headache with associated light noise sensitivity, nauseous, she was seen by neurologist, giving preventive medications Topamax 50 mg twice a day since 2018, also use Cefaly device, which has helpful,   For a while she was taking frequent over-the-counter medications ibuprofen, Excedrin Migraine, 20 migraine headache days in 1 month, she brought a migraine journal, she is now having 7-12 migraine days each month, takes Zomig 3-4 times each month as needed, but still taking frequent Excedrin Migraine, ibuprofen,   She has never tried Maxalt in the past, limited response to Imitrex.   UPDATE January 04 2019: She now has migraine 4-6 /month, add on Ajovy has made big difference, previously she was having 18-20 migraine days each month, her migraine is better controlled with Maxalt,   Update July 11, 2019 SS: Via Virtual visit, her headaches are doing really well, even better than in July. The Ajovy has made a huge difference. In January, she had 3 migraines, before migraine treatment in 2018 having migraines 20 days a month. Is taking Topamax 50 mg BID, Ajovy 225 mg every month, Maxalt as needed. Usually Maxalt works well, sometimes she will treat milder migraines with ibuprofen. She may have to use  combination of Maxalt, ibuprofen, phenergan. Tizanidine wasn't really helpful.    Update June 17, 2020 SS: Via VV, headaches doing really well, on average 3-5 a month migraines.  Great benefit with Ajovy, does wonder if she could switch to autoinjector.  Also on Topamax 50 mg twice a day.  For acute headache will take Maxalt, usually works within 20 minutes.  Usually triptan relieves the medication without combine with ibuprofen.  In the last year, only 1 severe headache.  In general do not interfere with function.  Is overall doing really well, used to have 15-20 migraines a month before Ajovy.   Update June 17, 2021 SS:   REVIEW OF SYSTEMS: Out of a complete 14 system review of symptoms, the patient complains only of the following symptoms, and all other reviewed systems are negative.  ALLERGIES: No Known Allergies  HOME MEDICATIONS: Outpatient Medications Prior to Visit  Medication Sig Dispense Refill   buPROPion (WELLBUTRIN SR) 200 MG 12 hr tablet Take 200 mg by mouth 2 (two) times daily.     cetirizine (ZYRTEC) 10 MG tablet Take 10 mg by mouth daily.     Ciclopirox 1 % shampoo Apply 1 application topically at bedtime.      clobetasol (TEMOVATE) 0.05 % external solution Apply 1 application topically 2 (two) times daily.     clonazePAM (KLONOPIN) 0.5 MG tablet Take 0.25-0.75 mg by mouth See admin instructions. Take 0.44m qam, 0.270mat noon     Coenzyme Q10 (COQ10) 100 MG CAPS Take 100 mg by mouth 2 (two) times daily.     fluocinonide ointment (LIDEX) 0.4.94 Apply 1 application topically  2 (two) times daily as needed (Dermatitis). Hands     Fremanezumab-vfrm (AJOVY) 225 MG/1.5ML SOSY Inject 225 mg into the skin every 30 (thirty) days. 1.5 mL 11   ibuprofen (ADVIL) 200 MG tablet Take 200 mg by mouth as needed.     ipratropium (ATROVENT) 0.06 % nasal spray Place 1 spray into both nostrils in the morning and at bedtime. 15 mL 5   L-Methylfolate (DEPLIN PO) Take 1 tablet by mouth at  bedtime.      levothyroxine (SYNTHROID) 112 MCG tablet Take 112 mcg by mouth every morning.     Multiple Vitamin (MULTIVITAMIN) tablet Take 1 tablet by mouth at bedtime.      Nerve Stimulator (CEFALY KIT) DEVI by Does not apply route daily.      Probiotic Product (PROBIOTIC PO) Take 1 capsule by mouth daily.     promethazine (PHENERGAN) 25 MG tablet ONE TABLET EVERY 6 HOURS AS NEEDED FOR NAUSEA/MIGRAINES. #30 PER MONTH. 30 tablet 2   rizatriptan (MAXALT) 10 MG tablet TAKE 1 TAB AS NEEDED FOR MIGRAINE. MAY REPEAT IN 2 HRS IF NEEDED MAX DOSE 2 TABS/DAY OR 15 TABS/MO 10 tablet 0   Sod Fluoride-Potassium Nitrate 1.1-5 % PSTE Place 1 application onto teeth at bedtime.      topiramate (TOPAMAX) 50 MG tablet Take 1 tablet (50 mg total) by mouth 2 (two) times daily. 180 tablet 3   traZODone (DESYREL) 50 MG tablet Take 25-50 mg by mouth at bedtime.     vortioxetine HBr (TRINTELLIX) 5 MG TABS tablet Trintellix 5 mg tablet     No facility-administered medications prior to visit.    PAST MEDICAL HISTORY: Past Medical History:  Diagnosis Date   Anterior pituitary hyperfunction (HCC)    Benign neoplasm of skin    Focal dystonia    Gait disorder    Genetic torsion dystonia    Intractable chronic migraine without aura and without status migrainosus    Iodine hypothyroidism    Irregular menses    Other neutropenia (HCC)    Seborrheic dermatitis    Thrombosed external hemorrhoid     PAST SURGICAL HISTORY: Past Surgical History:  Procedure Laterality Date   HYMENECTOMY      FAMILY HISTORY: Family History  Problem Relation Age of Onset   Dementia Mother    Hypertension Mother    Colon cancer Maternal Grandmother    Lung cancer Paternal Grandfather    Dementia Maternal Grandfather    Hypertension Maternal Grandfather    Breast cancer Paternal Grandmother     SOCIAL HISTORY: Social History   Socioeconomic History   Marital status: Divorced    Spouse name: Not on file   Number of  children: 0   Years of education: college   Highest education level: Master's degree (e.g., MA, MS, MEng, MEd, MSW, MBA)  Occupational History   Occupation: Does not work  Tobacco Use   Smoking status: Never   Smokeless tobacco: Never  Vaping Use   Vaping Use: Never used  Substance and Sexual Activity   Alcohol use: Yes    Comment: 1-2 drinks per month   Drug use: Never   Sexual activity: Yes  Other Topics Concern   Not on file  Social History Narrative   Lives at home alone.   Right-handed.   1 cup caffeine per day.   Social Determinants of Health   Financial Resource Strain: Not on file  Food Insecurity: Not on file  Transportation Needs: Not on file  Physical Activity: Not on file  Stress: Not on file  Social Connections: Not on file  Intimate Partner Violence: Not on file      PHYSICAL EXAM  There were no vitals filed for this visit. There is no height or weight on file to calculate BMI.  Generalized: Well developed, in no acute distress   Neurological examination  Mentation: Alert oriented to time, place, history taking. Follows all commands speech and language fluent Cranial nerve II-XII: Pupils were equal round reactive to light. Extraocular movements were full, visual field were full on confrontational test. Facial sensation and strength were normal. Uvula tongue midline. Head turning and shoulder shrug  were normal and symmetric. Motor: The motor testing reveals 5 over 5 strength of all 4 extremities. Good symmetric motor tone is noted throughout.  Sensory: Sensory testing is intact to soft touch on all 4 extremities. No evidence of extinction is noted.  Coordination: Cerebellar testing reveals good finger-nose-finger and heel-to-shin bilaterally.  Gait and station: Gait is normal. Tandem gait is normal. Romberg is negative. No drift is seen.  Reflexes: Deep tendon reflexes are symmetric and normal bilaterally.   DIAGNOSTIC DATA (LABS, IMAGING, TESTING) -  I reviewed patient records, labs, notes, testing and imaging myself where available.  Lab Results  Component Value Date   WBC 5.0 01/22/2020   HGB 13.6 01/22/2020   HCT 39.3 01/22/2020   MCV 87 01/22/2020   PLT 202 01/22/2020      Component Value Date/Time   NA 139 12/08/2018 1134   K 4.5 12/08/2018 1134   CL 106 12/08/2018 1134   CO2 25 12/08/2018 1134   GLUCOSE 92 12/08/2018 1134   BUN 13 12/08/2018 1134   CREATININE 0.97 12/08/2018 1134   CREATININE 0.80 05/23/2018 1241   CALCIUM 9.0 12/08/2018 1134   PROT 6.1 (L) 12/08/2018 1134   ALBUMIN 4.2 12/08/2018 1134   AST 20 12/08/2018 1134   AST 19 05/23/2018 1241   ALT 18 12/08/2018 1134   ALT 20 05/23/2018 1241   ALKPHOS 49 12/08/2018 1134   BILITOT 0.6 12/08/2018 1134   BILITOT 0.4 05/23/2018 1241   GFRNONAA >60 12/08/2018 1134   GFRNONAA >60 05/23/2018 1241   GFRAA >60 12/08/2018 1134   GFRAA >60 05/23/2018 1241   No results found for: CHOL, HDL, LDLCALC, LDLDIRECT, TRIG, CHOLHDL No results found for: HGBA1C Lab Results  Component Value Date   VITAMINB12 1,048 02/01/2019   No results found for: TSH    ASSESSMENT AND PLAN 51 y.o. year old female  has a past medical history of Anterior pituitary hyperfunction ([place]), Benign neoplasm of skin, Focal dystonia, Gait disorder, Genetic torsion dystonia, Intractable chronic migraine without aura and without status migrainosus, Iodine hypothyroidism, Irregular menses, Other neutropenia ([place]), Seborrheic dermatitis, and Thrombosed external hemorrhoid. here with:  1.  Chronic migraine headache     I spent 15 minutes with the patient. 50% of this time was spent   Butler Denmark, [place], DNP 06/16/2021, 4:44 PM [facility] [address], [place], [place] [address] [phone]

## 2021-06-17 ENCOUNTER — Ambulatory Visit: Payer: BC Managed Care – PPO | Admitting: Neurology

## 2021-06-24 DIAGNOSIS — F322 Major depressive disorder, single episode, severe without psychotic features: Secondary | ICD-10-CM | POA: Diagnosis not present

## 2021-06-25 DIAGNOSIS — F332 Major depressive disorder, recurrent severe without psychotic features: Secondary | ICD-10-CM | POA: Diagnosis not present

## 2021-06-26 ENCOUNTER — Other Ambulatory Visit: Payer: Self-pay | Admitting: Neurology

## 2021-06-29 NOTE — Telephone Encounter (Signed)
Rx refilled.

## 2021-07-01 DIAGNOSIS — F322 Major depressive disorder, single episode, severe without psychotic features: Secondary | ICD-10-CM | POA: Diagnosis not present

## 2021-07-02 DIAGNOSIS — F332 Major depressive disorder, recurrent severe without psychotic features: Secondary | ICD-10-CM | POA: Diagnosis not present

## 2021-07-03 DIAGNOSIS — G248 Other dystonia: Secondary | ICD-10-CM | POA: Diagnosis not present

## 2021-07-07 DIAGNOSIS — F332 Major depressive disorder, recurrent severe without psychotic features: Secondary | ICD-10-CM | POA: Diagnosis not present

## 2021-07-14 DIAGNOSIS — F332 Major depressive disorder, recurrent severe without psychotic features: Secondary | ICD-10-CM | POA: Diagnosis not present

## 2021-07-15 DIAGNOSIS — F322 Major depressive disorder, single episode, severe without psychotic features: Secondary | ICD-10-CM | POA: Diagnosis not present

## 2021-07-21 DIAGNOSIS — F332 Major depressive disorder, recurrent severe without psychotic features: Secondary | ICD-10-CM | POA: Diagnosis not present

## 2021-07-22 DIAGNOSIS — F322 Major depressive disorder, single episode, severe without psychotic features: Secondary | ICD-10-CM | POA: Diagnosis not present

## 2021-07-28 DIAGNOSIS — F332 Major depressive disorder, recurrent severe without psychotic features: Secondary | ICD-10-CM | POA: Diagnosis not present

## 2021-07-29 ENCOUNTER — Other Ambulatory Visit: Payer: Self-pay | Admitting: Neurology

## 2021-07-29 NOTE — Telephone Encounter (Signed)
Rx's refilled. 

## 2021-07-30 DIAGNOSIS — F322 Major depressive disorder, single episode, severe without psychotic features: Secondary | ICD-10-CM | POA: Diagnosis not present

## 2021-08-04 DIAGNOSIS — F332 Major depressive disorder, recurrent severe without psychotic features: Secondary | ICD-10-CM | POA: Diagnosis not present

## 2021-08-05 DIAGNOSIS — F322 Major depressive disorder, single episode, severe without psychotic features: Secondary | ICD-10-CM | POA: Diagnosis not present

## 2021-08-10 DIAGNOSIS — M1990 Unspecified osteoarthritis, unspecified site: Secondary | ICD-10-CM | POA: Diagnosis not present

## 2021-08-10 DIAGNOSIS — M151 Heberden's nodes (with arthropathy): Secondary | ICD-10-CM | POA: Diagnosis not present

## 2021-08-10 DIAGNOSIS — R21 Rash and other nonspecific skin eruption: Secondary | ICD-10-CM | POA: Diagnosis not present

## 2021-08-10 DIAGNOSIS — R768 Other specified abnormal immunological findings in serum: Secondary | ICD-10-CM | POA: Diagnosis not present

## 2021-08-11 DIAGNOSIS — F332 Major depressive disorder, recurrent severe without psychotic features: Secondary | ICD-10-CM | POA: Diagnosis not present

## 2021-08-12 DIAGNOSIS — F322 Major depressive disorder, single episode, severe without psychotic features: Secondary | ICD-10-CM | POA: Diagnosis not present

## 2021-08-17 DIAGNOSIS — H5213 Myopia, bilateral: Secondary | ICD-10-CM | POA: Diagnosis not present

## 2021-08-18 ENCOUNTER — Other Ambulatory Visit: Payer: Self-pay | Admitting: Neurology

## 2021-08-18 DIAGNOSIS — F332 Major depressive disorder, recurrent severe without psychotic features: Secondary | ICD-10-CM | POA: Diagnosis not present

## 2021-08-19 DIAGNOSIS — F322 Major depressive disorder, single episode, severe without psychotic features: Secondary | ICD-10-CM | POA: Diagnosis not present

## 2021-08-24 DIAGNOSIS — F332 Major depressive disorder, recurrent severe without psychotic features: Secondary | ICD-10-CM | POA: Diagnosis not present

## 2021-08-26 DIAGNOSIS — F322 Major depressive disorder, single episode, severe without psychotic features: Secondary | ICD-10-CM | POA: Diagnosis not present

## 2021-09-02 DIAGNOSIS — F322 Major depressive disorder, single episode, severe without psychotic features: Secondary | ICD-10-CM | POA: Diagnosis not present

## 2021-09-08 DIAGNOSIS — F332 Major depressive disorder, recurrent severe without psychotic features: Secondary | ICD-10-CM | POA: Diagnosis not present

## 2021-09-15 DIAGNOSIS — F332 Major depressive disorder, recurrent severe without psychotic features: Secondary | ICD-10-CM | POA: Diagnosis not present

## 2021-09-18 DIAGNOSIS — G249 Dystonia, unspecified: Secondary | ICD-10-CM | POA: Diagnosis not present

## 2021-09-23 DIAGNOSIS — F322 Major depressive disorder, single episode, severe without psychotic features: Secondary | ICD-10-CM | POA: Diagnosis not present

## 2021-09-29 DIAGNOSIS — F332 Major depressive disorder, recurrent severe without psychotic features: Secondary | ICD-10-CM | POA: Diagnosis not present

## 2021-09-30 DIAGNOSIS — F322 Major depressive disorder, single episode, severe without psychotic features: Secondary | ICD-10-CM | POA: Diagnosis not present

## 2021-10-01 DIAGNOSIS — D2262 Melanocytic nevi of left upper limb, including shoulder: Secondary | ICD-10-CM | POA: Diagnosis not present

## 2021-10-01 DIAGNOSIS — D225 Melanocytic nevi of trunk: Secondary | ICD-10-CM | POA: Diagnosis not present

## 2021-10-01 DIAGNOSIS — D2261 Melanocytic nevi of right upper limb, including shoulder: Secondary | ICD-10-CM | POA: Diagnosis not present

## 2021-10-01 DIAGNOSIS — L821 Other seborrheic keratosis: Secondary | ICD-10-CM | POA: Diagnosis not present

## 2021-10-06 DIAGNOSIS — F322 Major depressive disorder, single episode, severe without psychotic features: Secondary | ICD-10-CM | POA: Diagnosis not present

## 2021-10-07 DIAGNOSIS — F332 Major depressive disorder, recurrent severe without psychotic features: Secondary | ICD-10-CM | POA: Diagnosis not present

## 2021-10-08 DIAGNOSIS — E890 Postprocedural hypothyroidism: Secondary | ICD-10-CM | POA: Diagnosis not present

## 2021-10-12 DIAGNOSIS — F332 Major depressive disorder, recurrent severe without psychotic features: Secondary | ICD-10-CM | POA: Diagnosis not present

## 2021-10-14 ENCOUNTER — Other Ambulatory Visit: Payer: Self-pay | Admitting: Family Medicine

## 2021-10-14 DIAGNOSIS — F322 Major depressive disorder, single episode, severe without psychotic features: Secondary | ICD-10-CM | POA: Diagnosis not present

## 2021-10-15 DIAGNOSIS — E890 Postprocedural hypothyroidism: Secondary | ICD-10-CM | POA: Diagnosis not present

## 2021-10-15 DIAGNOSIS — G4700 Insomnia, unspecified: Secondary | ICD-10-CM | POA: Diagnosis not present

## 2021-10-15 DIAGNOSIS — R5382 Chronic fatigue, unspecified: Secondary | ICD-10-CM | POA: Diagnosis not present

## 2021-10-15 DIAGNOSIS — Z8639 Personal history of other endocrine, nutritional and metabolic disease: Secondary | ICD-10-CM | POA: Diagnosis not present

## 2021-10-21 DIAGNOSIS — F322 Major depressive disorder, single episode, severe without psychotic features: Secondary | ICD-10-CM | POA: Diagnosis not present

## 2021-10-22 DIAGNOSIS — F332 Major depressive disorder, recurrent severe without psychotic features: Secondary | ICD-10-CM | POA: Diagnosis not present

## 2021-10-26 ENCOUNTER — Telehealth: Payer: Self-pay | Admitting: *Deleted

## 2021-10-26 NOTE — Telephone Encounter (Signed)
PA approved through 10/25/2022.

## 2021-10-26 NOTE — Telephone Encounter (Signed)
PA for Ajovy '225mg'$  started on covermymeds (key: [id number]). Pharmacy coverage through [facility] [phone]). Decision pending.

## 2021-10-27 DIAGNOSIS — F332 Major depressive disorder, recurrent severe without psychotic features: Secondary | ICD-10-CM | POA: Diagnosis not present

## 2021-10-31 ENCOUNTER — Ambulatory Visit (HOSPITAL_COMMUNITY)
Admission: EM | Admit: 2021-10-31 | Discharge: 2021-10-31 | Disposition: A | Discharge status: Home / Self Care | Payer: BC Managed Care – PPO | Attending: Emergency Medicine | Admitting: Emergency Medicine

## 2021-10-31 ENCOUNTER — Encounter (HOSPITAL_COMMUNITY): Payer: Self-pay | Admitting: *Deleted

## 2021-10-31 ENCOUNTER — Other Ambulatory Visit: Payer: Self-pay

## 2021-10-31 DIAGNOSIS — S61214A Laceration without foreign body of right ring finger without damage to nail, initial encounter: Secondary | ICD-10-CM | POA: Diagnosis not present

## 2021-10-31 MED ORDER — CEPHALEXIN 500 MG PO CAPS: 500.0000 mg | ORAL_CAPSULE | Freq: Two times a day (BID) | ORAL | 0 refills | Status: AC

## 2021-10-31 NOTE — ED Triage Notes (Signed)
Pt reports she cut Rt ring finger 5 days ago on a metal bottle . Pt reported site was healing but has reopened. and

## 2021-10-31 NOTE — ED Provider Notes (Signed)
[facility]    CSN: [id number] Arrival date & time: 10/31/21  1639      History   Chief Complaint Chief Complaint  Patient presents with   Abrasion    Entered by patient   Laceration    HPI Rose Austin is a 51 y.o. female.   Patient presents with laceration to the right middle finger occurring 5 days ago after cutting on a metal bottle.  Did not seek treatment immediately.  Endorses that area was healing on its own but she is headed against object several times causing it to reopen.  Drainage only present when wound is initially reopened.  Denies erythema, swelling, pain, fever or chills.    Past Medical History:  Diagnosis Date   Anterior pituitary hyperfunction (HCC)    Benign neoplasm of skin    Focal dystonia    Gait disorder    Genetic torsion dystonia    Intractable chronic migraine without aura and without status migrainosus    Iodine hypothyroidism    Irregular menses    Other neutropenia (HCC)    Seborrheic dermatitis    Thrombosed external hemorrhoid     Patient Active Problem List   Diagnosis Date Noted   Recurrent UTI 03/02/2019   Lymphopenia 03/02/2019   Low memory T cell count determined by flow cytometry 03/02/2019   Low immunoglobulin level 05/23/2018   Chronic migraine w/o aura w/o status migrainosus, not intractable 05/12/2018    Past Surgical History:  Procedure Laterality Date   HYMENECTOMY      OB History   No obstetric history on file.      Home Medications    Prior to Admission medications   Medication Sig Start Date End Date Taking? Authorizing Provider  buPROPion (WELLBUTRIN SR) 200 MG 12 hr tablet Take 200 mg by mouth 2 (two) times daily.    [provider]  cetirizine (ZYRTEC) 10 MG tablet Take 10 mg by mouth daily.    [provider]  Ciclopirox 1 % shampoo Apply 1 application topically at bedtime.     [provider]  clobetasol (TEMOVATE) 0.05 % external solution Apply 1  application topically 2 (two) times daily.    [provider]  clonazePAM (KLONOPIN) 0.5 MG tablet Take 0.25-0.75 mg by mouth See admin instructions. Take 0.36m qam, 0.260mat noon    [provider]  Coenzyme Q10 (COQ10) 100 MG CAPS Take 100 mg by mouth 2 (two) times daily.    [provider]  fluocinonide ointment (LIDEX) 0.8.09 Apply 1 application topically 2 (two) times daily as needed (Dermatitis). Hands    [provider]  Fremanezumab-vfrm (AJOVY) 225 MG/1.5ML SOSY INJECT 225 MG INTO THE SKIN EVERY 30 (THIRTY) DAYS. 07/29/21   SlSuzzanne CloudNP  ibuprofen (ADVIL) 200 MG tablet Take 200 mg by mouth as needed.    [provider]  ipratropium (ATROVENT) 0.06 % nasal spray Place 1 spray into both nostrils in the morning and at bedtime. 04/29/20   GaValentina ShaggyMD  L-Methylfolate (DEPLIN PO) Take 1 tablet by mouth at bedtime.     [provider]  levothyroxine (SYNTHROID) 112 MCG tablet Take 112 mcg by mouth every morning. 10/01/20   [provider]  Multiple Vitamin (MULTIVITAMIN) tablet Take 1 tablet by mouth at bedtime.     [provider]  Nerve Stimulator (CEFALY KIT) DEVI by Does not apply route daily.     [provider]  Probiotic Product (  PROBIOTIC PO) Take 1 capsule by mouth daily.    [provider]  promethazine (PHENERGAN) 25 MG tablet ONE TABLET EVERY 6 HOURS AS NEEDED FOR NAUSEA/MIGRAINES. #30 PER MONTH. 05/14/21   Marcial Pacas, MD  rizatriptan (MAXALT) 10 MG tablet Take 1 tab at onset of migraine.  May repeat in 2 hrs, if needed.  Max dose: 2 tabs/day. This is a 30 day prescription. 08/19/21   Suzzanne Cloud, NP  Sod Fluoride-Potassium Nitrate 1.1-5 % PSTE Place 1 application onto teeth at bedtime.     [provider]  topiramate (TOPAMAX) 50 MG tablet Take 1 tablet (50 mg total) by mouth 2 (two) times daily. 01/12/21   Suzzanne Cloud, NP  traZODone (DESYREL) 50 MG tablet Take 25-50  mg by mouth at bedtime.    [provider]  vortioxetine HBr (TRINTELLIX) 5 MG TABS tablet Trintellix 5 mg tablet    [provider]    Family History Family History  Problem Relation Age of Onset   Dementia Mother    Hypertension Mother    Colon cancer Maternal Grandmother    Lung cancer Paternal Grandfather    Dementia Maternal Grandfather    Hypertension Maternal Grandfather    Breast cancer Paternal Grandmother     Social History Social History   Tobacco Use   Smoking status: Never   Smokeless tobacco: Never  Vaping Use   Vaping Use: Never used  Substance Use Topics   Alcohol use: Yes    Comment: 1-2 drinks per month   Drug use: Never     Allergies   Patient has no known allergies.   Review of Systems Review of Systems  Constitutional: Negative.   Respiratory: Negative.    Cardiovascular: Negative.   Skin:  Positive for wound. Negative for color change, pallor and rash.  Neurological: Negative.     Physical Exam Triage Vital Signs ED Triage Vitals  Enc Vitals Group     BP 10/31/21 1716 108/69     Pulse Rate 10/31/21 1716 (!) 56     Resp 10/31/21 1716 18     Temp 10/31/21 1716 98.1 F (36.7 C)     Temp src --      SpO2 10/31/21 1716 97 %     Weight --      Height --      Head Circumference --      Peak Flow --      Pain Score 10/31/21 1714 1     Pain Loc --      Pain Edu? --      Excl. in [place]? --    No data found.  Updated Vital Signs BP 108/69   Pulse (!) 56   Temp 98.1 F (36.7 C)   Resp 18   SpO2 97%   Visual Acuity Right Eye Distance:   Left Eye Distance:   Bilateral Distance:    Right Eye Near:   Left Eye Near:    Bilateral Near:     Physical Exam Constitutional:      Appearance: Normal appearance.  HENT:     Head: Normocephalic.  Eyes:     Extraocular Movements: Extraocular movements intact.  Pulmonary:     Effort: Pulmonary effort is normal.  Skin:    Comments: 1 x 0.5 cm laceration to the proximal  phalanx of the right ring finger, notable skin flap present, mild erythema surrounding the wound base and scant yellow eschar noted within the wound, range  of motion of finger intact, sensation intact, capillary refill less than 3  Neurological:     Mental Status: She is alert and oriented to person, place, and time. Mental status is at baseline.  Psychiatric:        Mood and Affect: Mood normal.        Behavior: Behavior normal.     UC Treatments / Results  Labs (all labs ordered are listed, but only abnormal results are displayed) Labs Reviewed - No data to display  EKG   Radiology No results found.  Procedures Procedures (including critical care time)  Medications Ordered in UC Medications - No data to display  Initial Impression / Assessment and Plan / UC Course  I have reviewed the triage vital signs and the nursing notes.  Pertinent labs & imaging results that were available during my care of the patient were reviewed by me and considered in my medical decision making (see chart for details).  Laceration of the right middle finger without foreign body without damage to the nail, initial encounter I  As laceration occurred 5 days ago, will not be able to close at this time, discussed with patient, wound bed has already begun to heal with opening, scar most likely to be present, discussed with patient, no overt signs of infection at this time, as wound has delayed healing to reopening, will cover for bacteria, Keflex 5-day course prescribed, recommended patient keep area covered with a nonadherent dressing and padded gauze to keep from hitting against objects to allow wound to properly heal, may follow-up with urgent care or primary doctor for reevaluation as needed Final Clinical Impressions(s) / UC Diagnoses   Final diagnoses:  None   Discharge Instructions   None    ED Prescriptions   None    PDMP not reviewed this encounter.   Hans Eden, [place] 11/01/21  [phone]

## 2021-10-31 NOTE — Discharge Instructions (Addendum)
5 days since time of injury, we are unable to close laceration today  Take Keflex twice a day for the next 5 days to cover for bacterial infection  You have been given nonstick pads, please cut little squares placed over the affected area and wrapped with gauze to prevent area from reopening for hitting with  Area Should continue to heal without complication, most likely will have a small scar present  Follow-up with urgent care as needed if any further concerns

## 2021-11-01 ENCOUNTER — Ambulatory Visit: Payer: BC Managed Care – PPO

## 2021-11-01 ENCOUNTER — Ambulatory Visit (HOSPITAL_COMMUNITY): Payer: BC Managed Care – PPO

## 2021-11-05 DIAGNOSIS — F332 Major depressive disorder, recurrent severe without psychotic features: Secondary | ICD-10-CM | POA: Diagnosis not present

## 2021-11-11 DIAGNOSIS — F322 Major depressive disorder, single episode, severe without psychotic features: Secondary | ICD-10-CM | POA: Diagnosis not present

## 2021-11-19 DIAGNOSIS — F332 Major depressive disorder, recurrent severe without psychotic features: Secondary | ICD-10-CM | POA: Diagnosis not present

## 2021-11-23 DIAGNOSIS — F332 Major depressive disorder, recurrent severe without psychotic features: Secondary | ICD-10-CM | POA: Diagnosis not present

## 2021-11-25 DIAGNOSIS — F322 Major depressive disorder, single episode, severe without psychotic features: Secondary | ICD-10-CM | POA: Diagnosis not present

## 2021-12-01 DIAGNOSIS — F332 Major depressive disorder, recurrent severe without psychotic features: Secondary | ICD-10-CM | POA: Diagnosis not present

## 2021-12-09 ENCOUNTER — Ambulatory Visit: Payer: BC Managed Care – PPO | Admitting: Neurology

## 2021-12-11 DIAGNOSIS — G249 Dystonia, unspecified: Secondary | ICD-10-CM | POA: Diagnosis not present

## 2021-12-22 ENCOUNTER — Telehealth: Payer: Self-pay | Admitting: Neurology

## 2021-12-22 NOTE — Telephone Encounter (Signed)
LVM and sent mychart msg informing pt of time change for 7/25 appt- Judson Roch out early.

## 2021-12-25 DIAGNOSIS — F332 Major depressive disorder, recurrent severe without psychotic features: Secondary | ICD-10-CM | POA: Diagnosis not present

## 2021-12-28 NOTE — Progress Notes (Deleted)
Patient: Rose Austin Date of Birth: 10-22-1970  Reason for Visit: Follow up History from: Patient Primary Neurologist:    ASSESSMENT AND PLAN 51 y.o. year old female    [name] is a 51 year old female, seen in request by her primary care PA Marda Stalker, for evaluation of migraine headaches, initial evaluation was on May 12, 2018.   I have reviewed and summarized the referring note from the referring physician.  She had a past medical history of hypothyroidism, on supplement.   She had a history of migraine headaches since her 91s, trigger for migraines are stress, sleep deprivation, weather changes, around 2017, she was getting frequent migraine headaches, her typical migraine are left-sided severe pounding headache with associated light noise sensitivity, nauseous, she was seen by neurologist, giving preventive medications Topamax 50 mg twice a day since 2018, also use Cefaly device, which has helpful,   For a while she was taking frequent over-the-counter medications ibuprofen, Excedrin Migraine, 20 migraine headache days in 1 month, she brought a migraine journal, she is now having 7-12 migraine days each month, takes Zomig 3-4 times each month as needed, but still taking frequent Excedrin Migraine, ibuprofen,   She has never tried Maxalt in the past, limited response to Imitrex.   UPDATE January 04 2019: She now has migraine 4-6 /month, add on Ajovy has made big difference, previously she was having 18-20 migraine days each month, her migraine is better controlled with Maxalt,   Update July 11, 2019 SS: Via Virtual visit, her headaches are doing really well, even better than in July. The Ajovy has made a huge difference. In January, she had 3 migraines, before migraine treatment in 2018 having migraines 20 days a month. Is taking Topamax 50 mg BID, Ajovy 225 mg every month, Maxalt as needed. Usually Maxalt works well, sometimes she will treat milder  migraines with ibuprofen. She may have to use combination of Maxalt, ibuprofen, phenergan. Tizanidine wasn't really helpful.    Update June 17, 2020 SS: Via VV, headaches doing really well, on average 3-5 a month migraines.  Great benefit with Ajovy, does wonder if she could switch to autoinjector.  Also on Topamax 50 mg twice a day.  For acute headache will take Maxalt, usually works within 20 minutes.  Usually triptan relieves the medication without combine with ibuprofen.  In the last year, only 1 severe headache.  In general do not interfere with function.  Is overall doing really well, used to have 15-20 migraines a month before Ajovy.  Update 12/29/21 SS:     REVIEW OF SYSTEMS: Out of a complete 14 system review of symptoms, the patient complains only of the following symptoms, and all other reviewed systems are negative.  See HPI  ALLERGIES: No Known Allergies  HOME MEDICATIONS: Outpatient Medications Prior to Visit  Medication Sig Dispense Refill   buPROPion (WELLBUTRIN SR) 200 MG 12 hr tablet Take 200 mg by mouth 2 (two) times daily.     cetirizine (ZYRTEC) 10 MG tablet Take 10 mg by mouth daily.     Ciclopirox 1 % shampoo Apply 1 application topically at bedtime.      clobetasol (TEMOVATE) 0.05 % external solution Apply 1 application topically 2 (two) times daily.     clonazePAM (KLONOPIN) 0.5 MG tablet Take 0.25-0.75 mg by mouth See admin instructions. Take 0.20m qam, 0.276mat noon     Coenzyme Q10 (COQ10) 100 MG CAPS Take 100 mg by mouth 2 (two) times  daily.     fluocinonide ointment (LIDEX) 0.05 % Apply 1 application topically 2 (two) times daily as needed (Dermatitis). Hands     Fremanezumab-vfrm (AJOVY) 225 MG/1.5ML SOSY INJECT 225 MG INTO THE SKIN EVERY 30 (THIRTY) DAYS. 1.5 mL 6   ibuprofen (ADVIL) 200 MG tablet Take 200 mg by mouth as needed.     ipratropium (ATROVENT) 0.06 % nasal spray Place 1 spray into both nostrils in the morning and at bedtime. 15 mL 5    L-Methylfolate (DEPLIN PO) Take 1 tablet by mouth at bedtime.      levothyroxine (SYNTHROID) 112 MCG tablet Take 112 mcg by mouth every morning.     Multiple Vitamin (MULTIVITAMIN) tablet Take 1 tablet by mouth at bedtime.      Nerve Stimulator (CEFALY KIT) DEVI by Does not apply route daily.      Probiotic Product (PROBIOTIC PO) Take 1 capsule by mouth daily.     promethazine (PHENERGAN) 25 MG tablet ONE TABLET EVERY 6 HOURS AS NEEDED FOR NAUSEA/MIGRAINES. #30 PER MONTH. 30 tablet 2   rizatriptan (MAXALT) 10 MG tablet Take 1 tab at onset of migraine.  May repeat in 2 hrs, if needed.  Max dose: 2 tabs/day. This is a 30 day prescription. 10 tablet 5   Sod Fluoride-Potassium Nitrate 1.1-5 % PSTE Place 1 application onto teeth at bedtime.      topiramate (TOPAMAX) 50 MG tablet Take 1 tablet (50 mg total) by mouth 2 (two) times daily. 180 tablet 3   traZODone (DESYREL) 50 MG tablet Take 25-50 mg by mouth at bedtime.     vortioxetine HBr (TRINTELLIX) 5 MG TABS tablet Trintellix 5 mg tablet     No facility-administered medications prior to visit.    PAST MEDICAL HISTORY: Past Medical History:  Diagnosis Date   Anterior pituitary hyperfunction (HCC)    Benign neoplasm of skin    Focal dystonia    Gait disorder    Genetic torsion dystonia    Intractable chronic migraine without aura and without status migrainosus    Iodine hypothyroidism    Irregular menses    Other neutropenia (HCC)    Seborrheic dermatitis    Thrombosed external hemorrhoid     PAST SURGICAL HISTORY: Past Surgical History:  Procedure Laterality Date   HYMENECTOMY      FAMILY HISTORY: Family History  Problem Relation Age of Onset   Dementia Mother    Hypertension Mother    Colon cancer Maternal Grandmother    Lung cancer Paternal Grandfather    Dementia Maternal Grandfather    Hypertension Maternal Grandfather    Breast cancer Paternal Grandmother     SOCIAL HISTORY: Social History   Socioeconomic History    Marital status: Divorced    Spouse name: Not on file   Number of children: 0   Years of education: college   Highest education level: Master's degree (e.g., MA, MS, MEng, MEd, MSW, MBA)  Occupational History   Occupation: Does not work  Tobacco Use   Smoking status: Never   Smokeless tobacco: Never  Vaping Use   Vaping Use: Never used  Substance and Sexual Activity   Alcohol use: Yes    Comment: 1-2 drinks per month   Drug use: Never   Sexual activity: Yes  Other Topics Concern   Not on file  Social History Narrative   Lives at home alone.   Right-handed.   1 cup caffeine per day.   Social Determinants of Health     Financial Resource Strain: Not on file  Food Insecurity: Not on file  Transportation Needs: Not on file  Physical Activity: Not on file  Stress: Not on file  Social Connections: Not on file  Intimate Partner Violence: Not on file    PHYSICAL EXAM  There were no vitals filed for this visit. There is no height or weight on file to calculate BMI.  Generalized: Well developed, in no acute distress  Neurological examination  Mentation: Alert oriented to time, place, history taking. Follows all commands speech and language fluent Cranial nerve II-XII: Pupils were equal round reactive to light. Extraocular movements were full, visual field were full on confrontational test. Facial sensation and strength were normal. Uvula tongue midline. Head turning and shoulder shrug  were normal and symmetric. Motor: The motor testing reveals 5 over 5 strength of all 4 extremities. Good symmetric motor tone is noted throughout.  Sensory: Sensory testing is intact to soft touch on all 4 extremities. No evidence of extinction is noted.  Coordination: Cerebellar testing reveals good finger-nose-finger and heel-to-shin bilaterally.  Gait and station: Gait is normal. Tandem gait is normal. Romberg is negative. No drift is seen.  Reflexes: Deep tendon reflexes are symmetric and  normal bilaterally.   DIAGNOSTIC DATA (LABS, IMAGING, TESTING) - I reviewed patient records, labs, notes, testing and imaging myself where available.  Lab Results  Component Value Date   WBC 5.0 01/22/2020   HGB 13.6 01/22/2020   HCT 39.3 01/22/2020   MCV 87 01/22/2020   PLT 202 01/22/2020      Component Value Date/Time   NA 139 12/08/2018 1134   K 4.5 12/08/2018 1134   CL 106 12/08/2018 1134   CO2 25 12/08/2018 1134   GLUCOSE 92 12/08/2018 1134   BUN 13 12/08/2018 1134   CREATININE 0.97 12/08/2018 1134   CREATININE 0.80 05/23/2018 1241   CALCIUM 9.0 12/08/2018 1134   PROT 6.1 (L) 12/08/2018 1134   ALBUMIN 4.2 12/08/2018 1134   AST 20 12/08/2018 1134   AST 19 05/23/2018 1241   ALT 18 12/08/2018 1134   ALT 20 05/23/2018 1241   ALKPHOS 49 12/08/2018 1134   BILITOT 0.6 12/08/2018 1134   BILITOT 0.4 05/23/2018 1241   GFRNONAA >60 12/08/2018 1134   GFRNONAA >60 05/23/2018 1241   GFRAA >60 12/08/2018 1134   GFRAA >60 05/23/2018 1241   No results found for: "CHOL", "HDL", "LDLCALC", "LDLDIRECT", "TRIG", "CHOLHDL" No results found for: "HGBA1C" Lab Results  Component Value Date   VITAMINB12 1,048 02/01/2019   No results found for: "TSH"  Butler Denmark, AGNP-C, DNP 12/28/2021, 4:32 PM [facility] [address] [place], [place] [address] [phone]

## 2021-12-29 ENCOUNTER — Ambulatory Visit: Payer: BC Managed Care – PPO | Admitting: Neurology

## 2021-12-29 ENCOUNTER — Telehealth: Payer: Self-pay | Admitting: Neurology

## 2021-12-29 NOTE — Telephone Encounter (Signed)
Pt cancelled due to having a migraine. Transferred to Billing.

## 2022-01-04 DIAGNOSIS — M19049 Primary osteoarthritis, unspecified hand: Secondary | ICD-10-CM | POA: Diagnosis not present

## 2022-01-05 DIAGNOSIS — F332 Major depressive disorder, recurrent severe without psychotic features: Secondary | ICD-10-CM | POA: Diagnosis not present

## 2022-01-07 DIAGNOSIS — F332 Major depressive disorder, recurrent severe without psychotic features: Secondary | ICD-10-CM | POA: Diagnosis not present

## 2022-01-12 DIAGNOSIS — F332 Major depressive disorder, recurrent severe without psychotic features: Secondary | ICD-10-CM | POA: Diagnosis not present

## 2022-01-14 ENCOUNTER — Other Ambulatory Visit: Payer: Self-pay | Admitting: Family Medicine

## 2022-01-14 DIAGNOSIS — Z1231 Encounter for screening mammogram for malignant neoplasm of breast: Secondary | ICD-10-CM

## 2022-01-22 ENCOUNTER — Other Ambulatory Visit: Payer: Self-pay | Admitting: Neurology

## 2022-01-26 DIAGNOSIS — F332 Major depressive disorder, recurrent severe without psychotic features: Secondary | ICD-10-CM | POA: Diagnosis not present

## 2022-01-27 DIAGNOSIS — F322 Major depressive disorder, single episode, severe without psychotic features: Secondary | ICD-10-CM | POA: Diagnosis not present

## 2022-01-31 NOTE — Progress Notes (Unsigned)
Patient: Rose Austin Date of Birth: 09-06-1970  Reason for Visit: Follow up History from: Patient Primary Neurologist: Rose Austin   ASSESSMENT AND PLAN 51 y.o. year old female      [name] is a 51 year old female, seen in request by her primary care PA Rose Austin, for evaluation of migraine headaches, initial evaluation was on May 12, 2018.   I have reviewed and summarized the referring note from the referring physician.  She had a past medical history of hypothyroidism, on supplement.   She had a history of migraine headaches since her 35s, trigger for migraines are stress, sleep deprivation, weather changes, around 2017, she was getting frequent migraine headaches, her typical migraine are left-sided severe pounding headache with associated light noise sensitivity, nauseous, she was seen by neurologist, giving preventive medications Topamax 50 mg twice a day since 2018, also use Cefaly device, which has helpful,   For a while she was taking frequent over-the-counter medications ibuprofen, Excedrin Migraine, 20 migraine headache days in 1 month, she brought a migraine journal, she is now having 7-12 migraine days each month, takes Zomig 3-4 times each month as needed, but still taking frequent Excedrin Migraine, ibuprofen,   She has never tried Maxalt in the past, limited response to Imitrex.   UPDATE January 04 2019: She now has migraine 4-6 /month, add on Ajovy has made big difference, previously she was having 18-20 migraine days each month, her migraine is better controlled with Maxalt,   Update July 11, 2019 SS: Via Virtual visit, her headaches are doing really well, even better than in July. The Ajovy has made a huge difference. In January, she had 3 migraines, before migraine treatment in 2018 having migraines 20 days a month. Is taking Topamax 50 mg BID, Ajovy 225 mg every month, Maxalt as needed. Usually Maxalt works well, sometimes she will treat  milder migraines with ibuprofen. She may have to use combination of Maxalt, ibuprofen, phenergan. Tizanidine wasn't really helpful.    Update June 17, 2020 SS: Via VV, headaches doing really well, on average 3-5 a month migraines.  Great benefit with Ajovy, does wonder if she could switch to autoinjector.  Also on Topamax 50 mg twice a day.  For acute headache will take Maxalt, usually works within 20 minutes.  Usually triptan relieves the medication without combine with ibuprofen.  In the last year, only 1 severe headache.  In general do not interfere with function.  Is overall doing really well, used to have 15-20 migraines a month before Ajovy.  Update February 01, 2022 SS:   REVIEW OF SYSTEMS: Out of a complete 14 system review of symptoms, the patient complains only of the following symptoms, and all other reviewed systems are negative.  See HPI  ALLERGIES: No Known Allergies  HOME MEDICATIONS: Outpatient Medications Prior to Visit  Medication Sig Dispense Refill   buPROPion (WELLBUTRIN SR) 200 MG 12 hr tablet Take 200 mg by mouth 2 (two) times daily.     cetirizine (ZYRTEC) 10 MG tablet Take 10 mg by mouth daily.     Ciclopirox 1 % shampoo Apply 1 application topically at bedtime.      clobetasol (TEMOVATE) 0.05 % external solution Apply 1 application topically 2 (two) times daily.     clonazePAM (KLONOPIN) 0.5 MG tablet Take 0.25-0.75 mg by mouth See admin instructions. Take 0.6m qam, 0.246mat noon     Coenzyme Q10 (COQ10) 100 MG CAPS Take 100 mg by mouth 2 (  two) times daily.     fluocinonide ointment (LIDEX) 7.84 % Apply 1 application topically 2 (two) times daily as needed (Dermatitis). Hands     Fremanezumab-vfrm (AJOVY) 225 MG/1.5ML SOSY INJECT 225 MG INTO THE SKIN EVERY 30 (THIRTY) DAYS. 1.5 mL 6   ibuprofen (ADVIL) 200 MG tablet Take 200 mg by mouth as needed.     ipratropium (ATROVENT) 0.06 % nasal spray Place 1 spray into both nostrils in the morning and at bedtime. 15 mL  5   L-Methylfolate (DEPLIN PO) Take 1 tablet by mouth at bedtime.      levothyroxine (SYNTHROID) 112 MCG tablet Take 112 mcg by mouth every morning.     Multiple Vitamin (MULTIVITAMIN) tablet Take 1 tablet by mouth at bedtime.      Nerve Stimulator (CEFALY KIT) DEVI by Does not apply route daily.      Probiotic Product (PROBIOTIC PO) Take 1 capsule by mouth daily.     promethazine (PHENERGAN) 25 MG tablet ONE TABLET EVERY 6 HOURS AS NEEDED FOR NAUSEA/MIGRAINES. #30 PER MONTH. 30 tablet 2   rizatriptan (MAXALT) 10 MG tablet Take 1 tab at onset of migraine.  May repeat in 2 hrs, if needed.  Max dose: 2 tabs/day. This is a 30 day prescription. 10 tablet 5   Sod Fluoride-Potassium Nitrate 1.1-5 % PSTE Place 1 application onto teeth at bedtime.      topiramate (TOPAMAX) 50 MG tablet TAKE 1 TABLET BY MOUTH TWICE A DAY 60 tablet 0   traZODone (DESYREL) 50 MG tablet Take 25-50 mg by mouth at bedtime.     vortioxetine HBr (TRINTELLIX) 5 MG TABS tablet Trintellix 5 mg tablet     No facility-administered medications prior to visit.    PAST MEDICAL HISTORY: Past Medical History:  Diagnosis Date   Anterior pituitary hyperfunction (HCC)    Benign neoplasm of skin    Focal dystonia    Gait disorder    Genetic torsion dystonia    Intractable chronic migraine without aura and without status migrainosus    Iodine hypothyroidism    Irregular menses    Other neutropenia (HCC)    Seborrheic dermatitis    Thrombosed external hemorrhoid     PAST SURGICAL HISTORY: Past Surgical History:  Procedure Laterality Date   HYMENECTOMY      FAMILY HISTORY: Family History  Problem Relation Age of Onset   Dementia Mother    Hypertension Mother    Colon cancer Maternal Grandmother    Lung cancer Paternal Grandfather    Dementia Maternal Grandfather    Hypertension Maternal Grandfather    Breast cancer Paternal Grandmother     SOCIAL HISTORY: Social History   Socioeconomic History   Marital status:  Divorced    Spouse name: Not on file   Number of children: 0   Years of education: college   Highest education level: Master's degree (e.g., MA, MS, MEng, MEd, MSW, MBA)  Occupational History   Occupation: Does not work  Tobacco Use   Smoking status: Never   Smokeless tobacco: Never  Vaping Use   Vaping Use: Never used  Substance and Sexual Activity   Alcohol use: Yes    Comment: 1-2 drinks per month   Drug use: Never   Sexual activity: Yes  Other Topics Concern   Not on file  Social History Narrative   Lives at home alone.   Right-handed.   1 cup caffeine per day.   Social Determinants of Radio broadcast assistant  Strain: Not on file  Food Insecurity: Not on file  Transportation Needs: Not on file  Physical Activity: Not on file  Stress: Not on file  Social Connections: Not on file  Intimate Partner Violence: Not on file    PHYSICAL EXAM  There were no vitals filed for this visit. There is no height or weight on file to calculate BMI.  Generalized: Well developed, in no acute distress  Neurological examination  Mentation: Alert oriented to time, place, history taking. Follows all commands speech and language fluent Cranial nerve II-XII: Pupils were equal round reactive to light. Extraocular movements were full, visual field were full on confrontational test. Facial sensation and strength were normal. Uvula tongue midline. Head turning and shoulder shrug  were normal and symmetric. Motor: The motor testing reveals 5 over 5 strength of all 4 extremities. Good symmetric motor tone is noted throughout.  Sensory: Sensory testing is intact to soft touch on all 4 extremities. No evidence of extinction is noted.  Coordination: Cerebellar testing reveals good finger-nose-finger and heel-to-shin bilaterally.  Gait and station: Gait is normal. Tandem gait is normal. Romberg is negative. No drift is seen.  Reflexes: Deep tendon reflexes are symmetric and normal bilaterally.    DIAGNOSTIC DATA (LABS, IMAGING, TESTING) - I reviewed patient records, labs, notes, testing and imaging myself where available.  Lab Results  Component Value Date   WBC 5.0 01/22/2020   HGB 13.6 01/22/2020   HCT 39.3 01/22/2020   MCV 87 01/22/2020   PLT 202 01/22/2020      Component Value Date/Time   NA 139 12/08/2018 1134   K 4.5 12/08/2018 1134   CL 106 12/08/2018 1134   CO2 25 12/08/2018 1134   GLUCOSE 92 12/08/2018 1134   BUN 13 12/08/2018 1134   CREATININE 0.97 12/08/2018 1134   CREATININE 0.80 05/23/2018 1241   CALCIUM 9.0 12/08/2018 1134   PROT 6.1 (L) 12/08/2018 1134   ALBUMIN 4.2 12/08/2018 1134   AST 20 12/08/2018 1134   AST 19 05/23/2018 1241   ALT 18 12/08/2018 1134   ALT 20 05/23/2018 1241   ALKPHOS 49 12/08/2018 1134   BILITOT 0.6 12/08/2018 1134   BILITOT 0.4 05/23/2018 1241   GFRNONAA >60 12/08/2018 1134   GFRNONAA >60 05/23/2018 1241   GFRAA >60 12/08/2018 1134   GFRAA >60 05/23/2018 1241   No results found for: "CHOL", "HDL", "LDLCALC", "LDLDIRECT", "TRIG", "CHOLHDL" No results found for: "HGBA1C" Lab Results  Component Value Date   VITAMINB12 1,048 02/01/2019   No results found for: "TSH"  Butler Denmark, AGNP-C, DNP 01/31/2022, 9:14 PM [facility] [address], [place], [place] [address] [phone]

## 2022-02-01 ENCOUNTER — Ambulatory Visit (INDEPENDENT_AMBULATORY_CARE_PROVIDER_SITE_OTHER): Payer: BC Managed Care – PPO | Admitting: Neurology

## 2022-02-01 VITALS — BP 103/72 | HR 82 | Ht 66.0 in | Wt 129.0 lb

## 2022-02-01 DIAGNOSIS — G43709 Chronic migraine without aura, not intractable, without status migrainosus: Secondary | ICD-10-CM | POA: Diagnosis not present

## 2022-02-01 MED ORDER — AJOVY 225 MG/1.5ML ~~LOC~~ SOSY: 225.0000 mg | PREFILLED_SYRINGE | SUBCUTANEOUS | 6 refills | Status: DC

## 2022-02-01 MED ORDER — UBRELVY 100 MG PO TABS: 100.0000 mg | ORAL_TABLET | ORAL | 11 refills | Status: DC | PRN

## 2022-02-01 MED ORDER — TOPIRAMATE 50 MG PO TABS: 50.0000 mg | ORAL_TABLET | Freq: Two times a day (BID) | ORAL | 1 refills | Status: DC

## 2022-02-01 NOTE — Patient Instructions (Signed)
Try the Ubrelvy at onset of headache Meds ordered this encounter  Medications   Ubrogepant (UBRELVY) 100 MG TABS    Sig: Take 100 mg by mouth as needed (take 1 headache at onset of headache, may repeat in 2 hours if needed, max is 200 mg in 24 hours).    Dispense:  10 tablet    Refill:  11   Fremanezumab-vfrm (AJOVY) 225 MG/1.5ML SOSY    Sig: Inject 225 mg into the skin every 30 (thirty) days.    Dispense:  1.5 mL    Refill:  6  .

## 2022-02-02 ENCOUNTER — Telehealth: Payer: Self-pay

## 2022-02-02 NOTE — Telephone Encounter (Signed)
PA for Roselyn Meier has been approved . This request has received a Favorable outcome from [place].  Please keep in mind this is not a guarantee of payment. Eligibility and Benefit determinations will be made at the time of service.  Please note any additional information provided by [facility] at the bottom of the screen.

## 2022-02-02 NOTE — Telephone Encounter (Signed)
PA for Rose Austin (Key: [id number])  Your information has been submitted to [place]. [employer] [place] will review the request and notify you of the determination decision directly, typically within 72 hours of receiving all information.  You will also receive your request decision electronically. To check for an update later, open this request again from your dashboard.  If [employer] [place] has not responded within the specified timeframe or if you have any

## 2022-02-03 ENCOUNTER — Ambulatory Visit: Payer: BC Managed Care – PPO

## 2022-02-03 ENCOUNTER — Telehealth: Payer: Self-pay | Admitting: Emergency Medicine

## 2022-02-03 ENCOUNTER — Ambulatory Visit: Admission: EM | Admit: 2022-02-03 | Payer: BC Managed Care – PPO

## 2022-02-03 ENCOUNTER — Ambulatory Visit
Admission: RE | Admit: 2022-02-03 | Discharge: 2022-02-03 | Disposition: A | Discharge status: Home / Self Care | Payer: BC Managed Care – PPO | Source: Ambulatory Visit | Attending: Emergency Medicine | Admitting: Emergency Medicine

## 2022-02-03 VITALS — BP 100/66 | HR 68 | Temp 98.0°F | Resp 16

## 2022-02-03 DIAGNOSIS — N3000 Acute cystitis without hematuria: Secondary | ICD-10-CM | POA: Diagnosis not present

## 2022-02-03 LAB — POCT URINALYSIS DIP (MANUAL ENTRY)
Bilirubin, UA: NEGATIVE
Blood, UA: NEGATIVE
Glucose, UA: NEGATIVE mg/dL
Ketones, POC UA: NEGATIVE mg/dL
Leukocytes, UA: NEGATIVE
Nitrite, UA: NEGATIVE
Protein Ur, POC: NEGATIVE mg/dL
Spec Grav, UA: 1.01 (ref 1.010–1.025)
Urobilinogen, UA: 0.2 E.U./dL
pH, UA: 7 (ref 5.0–8.0)

## 2022-02-03 MED ORDER — NITROFURANTOIN MONOHYD MACRO 100 MG PO CAPS: 100.0000 mg | ORAL_CAPSULE | Freq: Two times a day (BID) | ORAL | 0 refills | Status: AC

## 2022-02-03 NOTE — ED Triage Notes (Signed)
The patient states she is having discomfort with urination, lower abd pain and nausea.   Started: last night   Home interventions: motrin

## 2022-02-03 NOTE — ED Provider Notes (Signed)
[facility]    CSN: [id number] Arrival date & time: 02/03/22  1508    HISTORY   Chief Complaint  Patient presents with   Urinary Retention    Have classic symptoms of UTI. My regular dr can't see me until tomorrow and I'd rather not wait. - Entered by patient   HPI Rose Austin is a pleasant, 51 y.o. female who presents to urgent care today. Patient complains of pain with urination, suprapubic pain, sensation of incomplete emptying, increased frequency urge to urinate without meaningful urine production, and nausea.  Patient states her symptoms began last night.  Patient states she has significantly increased her intake of water intake and Motrin which has provided her with some relief of her symptoms.  Patient states she is reach out to her urologist office, who follows her for frequent urinary tract infections, they were unable to see her until tomorrow so she decided to come to urgent care today.  Patient denies flank pain, fever, chills, fatigue, malaise, history of infection with MDR gram-negative bacteria, recent hospitalization, recent travel, recent use of fluoroquinolone, Bactrim or third-generation cephalosporin, vaginal discharge, dyspareunia, abnormal vaginal bleeding, vaginal itching or burning, known exposure or risk of exposure to STD.  The history is provided by the patient.   Past Medical History:  Diagnosis Date   Anterior pituitary hyperfunction (HCC)    Benign neoplasm of skin    Focal dystonia    Gait disorder    Genetic torsion dystonia    Intractable chronic migraine without aura and without status migrainosus    Iodine hypothyroidism    Irregular menses    Other neutropenia (HCC)    Seborrheic dermatitis    Thrombosed external hemorrhoid    Patient Active Problem List   Diagnosis Date Noted   Recurrent UTI 03/02/2019   Lymphopenia 03/02/2019   Low memory T cell count determined by flow cytometry 03/02/2019   Low immunoglobulin level  05/23/2018   Chronic migraine w/o aura w/o status migrainosus, not intractable 05/12/2018   Past Surgical History:  Procedure Laterality Date   HYMENECTOMY     OB History   No obstetric history on file.    Home Medications    Prior to Admission medications   Medication Sig Start Date End Date Taking? Authorizing Provider  buPROPion (WELLBUTRIN SR) 200 MG 12 hr tablet Take 200 mg by mouth 2 (two) times daily.    [provider]  cetirizine (ZYRTEC) 10 MG tablet Take 10 mg by mouth daily.    [provider]  Ciclopirox 1 % shampoo Apply 1 application topically at bedtime.     [provider]  clobetasol (TEMOVATE) 0.05 % external solution Apply 1 application topically 2 (two) times daily.    [provider]  clonazePAM (KLONOPIN) 0.5 MG tablet Take 0.25-0.75 mg by mouth See admin instructions. Take 0.85m qam, 0.250mat noon    [provider]  Coenzyme Q10 (COQ10) 100 MG CAPS Take 100 mg by mouth 2 (two) times daily.    [provider]  fluocinonide ointment (LIDEX) 0.5.69 Apply 1 application topically 2 (two) times daily as needed (Dermatitis). Hands    [provider]  Fremanezumab-vfrm (AJOVY) 225 MG/1.5ML SOSY Inject 225 mg into the skin every 30 (thirty) days. 02/01/22   SlSuzzanne CloudNP  ibuprofen (ADVIL) 200 MG tablet Take 200 mg by mouth as needed.    [provider]  ipratropium (ATROVENT) 0.06 % nasal spray Place 1 spray into  both nostrils in the morning and at bedtime. 04/29/20   Valentina Shaggy, MD  L-Methylfolate (DEPLIN PO) Take 1 tablet by mouth at bedtime.     [provider]  lithium 300 MG tablet Take 300 mg by mouth daily.    [provider]  Multiple Vitamin (MULTIVITAMIN) tablet Take 1 tablet by mouth at bedtime.     [provider]  Nerve Stimulator (CEFALY KIT) DEVI by Does not apply route daily.     [provider]  Probiotic Product (PROBIOTIC PO)  Take 1 capsule by mouth daily.    [provider]  promethazine (PHENERGAN) 25 MG tablet ONE TABLET EVERY 6 HOURS AS NEEDED FOR NAUSEA/MIGRAINES. #30 PER MONTH. 05/14/21   Marcial Pacas, MD  rizatriptan (MAXALT) 10 MG tablet Take 1 tab at onset of migraine.  May repeat in 2 hrs, if needed.  Max dose: 2 tabs/day. This is a 30 day prescription. 08/19/21   Suzzanne Cloud, NP  Sod Fluoride-Potassium Nitrate 1.1-5 % PSTE Place 1 application onto teeth at bedtime.     [provider]  topiramate (TOPAMAX) 50 MG tablet Take 1 tablet (50 mg total) by mouth 2 (two) times daily. 02/01/22   Suzzanne Cloud, NP  traZODone (DESYREL) 50 MG tablet Take 25-50 mg by mouth at bedtime.    [provider]  Ubrogepant (UBRELVY) 100 MG TABS Take 100 mg by mouth as needed (take 1 headache at onset of headache, may repeat in 2 hours if needed, max is 200 mg in 24 hours). 02/01/22   Suzzanne Cloud, NP    Family History Family History  Problem Relation Age of Onset   Dementia Mother    Hypertension Mother    Colon cancer Maternal Grandmother    Lung cancer Paternal Grandfather    Dementia Maternal Grandfather    Hypertension Maternal Grandfather    Breast cancer Paternal Grandmother    Social History Social History   Tobacco Use   Smoking status: Never   Smokeless tobacco: Never  Vaping Use   Vaping Use: Never used  Substance Use Topics   Alcohol use: Yes    Comment: 1-2 drinks per month   Drug use: Never   Allergies   Patient has no known allergies.  Review of Systems Review of Systems Pertinent findings revealed after performing a 14 point review of systems has been noted in the history of present illness.  Physical Exam Triage Vital Signs ED Triage Vitals  Enc Vitals Group     BP 04/03/21 0827 (!) 147/82     Pulse Rate 04/03/21 0827 72     Resp 04/03/21 0827 18     Temp 04/03/21 0827 98.3 F (36.8 C)     Temp Source 04/03/21 0827 Oral     SpO2 04/03/21 0827 98 %      Weight --      Height --      Head Circumference --      Peak Flow --      Pain Score 04/03/21 0826 5     Pain Loc --      Pain Edu? --      Excl. in [place]? --   No data found.  Updated Vital Signs BP 100/66 (BP Location: Right Arm)   Pulse 68   Temp 98 F (36.7 C) (Oral)   Resp 16   SpO2 96%   Physical Exam Vitals and nursing note reviewed.  Constitutional:  General: She is not in acute distress.    Appearance: Normal appearance. She is not ill-appearing.  HENT:     Head: Normocephalic and atraumatic.  Eyes:     General: Lids are normal.        Right eye: No discharge.        Left eye: No discharge.     Extraocular Movements: Extraocular movements intact.     Conjunctiva/sclera: Conjunctivae normal.     Right eye: Right conjunctiva is not injected.     Left eye: Left conjunctiva is not injected.  Neck:     Trachea: Trachea and phonation normal.  Cardiovascular:     Rate and Rhythm: Normal rate and regular rhythm.     Pulses: Normal pulses.     Heart sounds: Normal heart sounds. No murmur heard.    No friction rub. No gallop.  Pulmonary:     Effort: Pulmonary effort is normal. No accessory muscle usage, prolonged expiration or respiratory distress.     Breath sounds: Normal breath sounds. No stridor, decreased air movement or transmitted upper airway sounds. No decreased breath sounds, wheezing, rhonchi or rales.  Chest:     Chest wall: No tenderness.  Abdominal:     General: Abdomen is flat. Bowel sounds are normal. There is no distension.     Palpations: Abdomen is soft.     Tenderness: There is abdominal tenderness in the suprapubic area. There is no right CVA tenderness or left CVA tenderness.     Hernia: No hernia is present.  Musculoskeletal:        General: Normal range of motion.     Cervical back: Normal range of motion and neck supple. Normal range of motion.  Lymphadenopathy:     Cervical: No cervical adenopathy.  Skin:    General: Skin is warm and  dry.     Findings: No erythema or rash.  Neurological:     General: No focal deficit present.     Mental Status: She is alert and oriented to person, place, and time.  Psychiatric:        Mood and Affect: Mood normal.        Behavior: Behavior normal.     Visual Acuity Right Eye Distance:   Left Eye Distance:   Bilateral Distance:    Right Eye Near:   Left Eye Near:    Bilateral Near:     UC Couse / Diagnostics / Procedures:     Radiology No results found.  Procedures Procedures (including critical care time) EKG  Pending results:  Labs Reviewed  POCT URINALYSIS DIP (MANUAL ENTRY) - Abnormal    Medications Ordered in UC: Medications - No data to display  UC Diagnoses / Final Clinical Impressions(s)   I have reviewed the triage vital signs and the nursing notes.  Pertinent labs & imaging results that were available during my care of the patient were reviewed by me and considered in my medical decision making (see chart for details).    Final diagnoses:  Acute cystitis without hematuria   Based on recommendations from up-to-date, patient will be treated empirically with Macrobid for presumed acute cystitis based on patient's complaints.  Urine culture not indicated.  Patient advised to follow-up in the next 3 to 5 days if no improvement of symptoms.  ED Prescriptions     Medication Sig Dispense Auth. Provider   nitrofurantoin, macrocrystal-monohydrate, (MACROBID) 100 MG capsule Take 1 capsule (100 mg total) by mouth 2 (two) times daily  for 5 days. 10 capsule Lynden Oxford Scales, PA-C      PDMP not reviewed this encounter.  Disposition Upon Discharge:  Condition: stable for discharge home  Patient presented with concern for an acute illness with associated systemic symptoms and significant discomfort requiring urgent management. In my opinion, this is a condition that a prudent lay person (someone who possesses an average knowledge of health and medicine)  may potentially expect to result in complications if not addressed urgently such as respiratory distress, impairment of bodily function or dysfunction of bodily organs.   As such, the patient has been evaluated and assessed, work-up was performed and treatment was provided in alignment with urgent care protocols and evidence based medicine.  Patient/parent/caregiver has been advised that the patient may require follow up for further testing and/or treatment if the symptoms continue in spite of treatment, as clinically indicated and appropriate.  Routine symptom specific, illness specific and/or disease specific instructions were discussed with the patient and/or caregiver at length.  Prevention strategies for avoiding STD exposure were also discussed.  The patient will follow up with their current PCP if and as advised. If the patient does not currently have a PCP we will assist them in obtaining one.   The patient may need specialty follow up if the symptoms continue, in spite of conservative treatment and management, for further workup, evaluation, consultation and treatment as clinically indicated and appropriate.  Patient/parent/caregiver verbalized understanding and agreement of plan as discussed.  All questions were addressed during visit.  Please see discharge instructions below for further details of plan.  Discharge Instructions:   Discharge Instructions      I have sent a prescription for Macrobid to your pharmacy because you are having symptoms of a urinary tract infection which include pain with urination, pain in your lower abdomen, increased urge to urinate frequently without meaningful production of urine and feeling like you cannot completely empty your bladder.  As you stated, these are "classic symptoms" of an uncomplicated urinary tract infection.  Please take 1 tablet twice daily for the next 3 days.  If you do not have meaningful relief of your symptoms after taking 3 days of  antibiotics or if your symptoms become worse, please either go to your urologist office or return to urgent care for repeat evaluation.  Urine culture would be indicated at that time.  Thank you for visiting urgent care today.      This office note has been dictated using Museum/gallery curator.  Unfortunately, this method of dictation can sometimes lead to typographical or grammatical errors.  I apologize for your inconvenience in advance if this occurs.  Please do not hesitate to reach out to me if clarification is needed.       Lynden Oxford Scales, [place] 02/03/22 [phone]

## 2022-02-03 NOTE — Telephone Encounter (Signed)
Initial prescription for Macrobid was sent for 3 days.  Patient should have received 5 days.  Additional 2 days of Macrobid sent to patient's pharmacy.

## 2022-02-03 NOTE — Discharge Instructions (Addendum)
I have sent a prescription for Macrobid to your pharmacy because you are having symptoms of a urinary tract infection which include pain with urination, pain in your lower abdomen, increased urge to urinate frequently without meaningful production of urine and feeling like you cannot completely empty your bladder.  As you stated, these are "classic symptoms" of an uncomplicated urinary tract infection.  Please take 1 tablet twice daily for the next 3 days.  If you do not have meaningful relief of your symptoms after taking 3 days of antibiotics or if your symptoms become worse, please either go to your urologist office or return to urgent care for repeat evaluation.  Urine culture would be indicated at that time.  Thank you for visiting urgent care today.

## 2022-02-04 DIAGNOSIS — F332 Major depressive disorder, recurrent severe without psychotic features: Secondary | ICD-10-CM | POA: Diagnosis not present

## 2022-02-17 DIAGNOSIS — F332 Major depressive disorder, recurrent severe without psychotic features: Secondary | ICD-10-CM | POA: Diagnosis not present

## 2022-02-18 DIAGNOSIS — Z79899 Other long term (current) drug therapy: Secondary | ICD-10-CM | POA: Diagnosis not present

## 2022-02-18 DIAGNOSIS — M329 Systemic lupus erythematosus, unspecified: Secondary | ICD-10-CM | POA: Diagnosis not present

## 2022-02-22 ENCOUNTER — Ambulatory Visit: Payer: BC Managed Care – PPO

## 2022-02-23 DIAGNOSIS — F332 Major depressive disorder, recurrent severe without psychotic features: Secondary | ICD-10-CM | POA: Diagnosis not present

## 2022-02-25 DIAGNOSIS — F332 Major depressive disorder, recurrent severe without psychotic features: Secondary | ICD-10-CM | POA: Diagnosis not present

## 2022-02-26 ENCOUNTER — Other Ambulatory Visit: Payer: Self-pay | Admitting: Neurology

## 2022-03-02 DIAGNOSIS — F332 Major depressive disorder, recurrent severe without psychotic features: Secondary | ICD-10-CM | POA: Diagnosis not present

## 2022-03-03 DIAGNOSIS — F322 Major depressive disorder, single episode, severe without psychotic features: Secondary | ICD-10-CM | POA: Diagnosis not present

## 2022-03-05 DIAGNOSIS — G249 Dystonia, unspecified: Secondary | ICD-10-CM | POA: Diagnosis not present

## 2022-03-05 DIAGNOSIS — Z9889 Other specified postprocedural states: Secondary | ICD-10-CM | POA: Diagnosis not present

## 2022-03-09 DIAGNOSIS — F332 Major depressive disorder, recurrent severe without psychotic features: Secondary | ICD-10-CM | POA: Diagnosis not present

## 2022-03-16 ENCOUNTER — Ambulatory Visit: Payer: BC Managed Care – PPO

## 2022-03-17 DIAGNOSIS — F332 Major depressive disorder, recurrent severe without psychotic features: Secondary | ICD-10-CM | POA: Diagnosis not present

## 2022-03-25 DIAGNOSIS — N3020 Other chronic cystitis without hematuria: Secondary | ICD-10-CM | POA: Diagnosis not present

## 2022-03-25 DIAGNOSIS — E890 Postprocedural hypothyroidism: Secondary | ICD-10-CM | POA: Diagnosis not present

## 2022-03-30 DIAGNOSIS — F332 Major depressive disorder, recurrent severe without psychotic features: Secondary | ICD-10-CM | POA: Diagnosis not present

## 2022-04-01 DIAGNOSIS — Z23 Encounter for immunization: Secondary | ICD-10-CM | POA: Diagnosis not present

## 2022-04-01 DIAGNOSIS — E890 Postprocedural hypothyroidism: Secondary | ICD-10-CM | POA: Diagnosis not present

## 2022-04-05 ENCOUNTER — Telehealth: Payer: Self-pay | Admitting: Neurology

## 2022-04-05 NOTE — Telephone Encounter (Signed)
LVM and sent mychart msg informing pt of r/s needed for 12/26 appt- NP out.

## 2022-04-13 DIAGNOSIS — F332 Major depressive disorder, recurrent severe without psychotic features: Secondary | ICD-10-CM | POA: Diagnosis not present

## 2022-04-22 DIAGNOSIS — F332 Major depressive disorder, recurrent severe without psychotic features: Secondary | ICD-10-CM | POA: Diagnosis not present

## 2022-04-27 DIAGNOSIS — F3341 Major depressive disorder, recurrent, in partial remission: Secondary | ICD-10-CM | POA: Diagnosis not present

## 2022-04-27 DIAGNOSIS — Z13220 Encounter for screening for lipoid disorders: Secondary | ICD-10-CM | POA: Diagnosis not present

## 2022-04-27 DIAGNOSIS — G43009 Migraine without aura, not intractable, without status migrainosus: Secondary | ICD-10-CM | POA: Diagnosis not present

## 2022-04-27 DIAGNOSIS — E559 Vitamin D deficiency, unspecified: Secondary | ICD-10-CM | POA: Diagnosis not present

## 2022-04-27 DIAGNOSIS — D839 Common variable immunodeficiency, unspecified: Secondary | ICD-10-CM | POA: Diagnosis not present

## 2022-04-27 DIAGNOSIS — Z0000 Encounter for general adult medical examination without abnormal findings: Secondary | ICD-10-CM | POA: Diagnosis not present

## 2022-05-10 ENCOUNTER — Ambulatory Visit
Admission: RE | Admit: 2022-05-10 | Discharge: 2022-05-10 | Disposition: A | Discharge status: Home / Self Care | Payer: BC Managed Care – PPO | Source: Ambulatory Visit | Attending: Family Medicine | Admitting: Family Medicine

## 2022-05-10 DIAGNOSIS — Z1231 Encounter for screening mammogram for malignant neoplasm of breast: Secondary | ICD-10-CM | POA: Diagnosis not present

## 2022-05-11 DIAGNOSIS — F332 Major depressive disorder, recurrent severe without psychotic features: Secondary | ICD-10-CM | POA: Diagnosis not present

## 2022-05-12 ENCOUNTER — Other Ambulatory Visit: Payer: Self-pay | Admitting: Family Medicine

## 2022-05-12 ENCOUNTER — Other Ambulatory Visit: Payer: Self-pay | Admitting: Neurology

## 2022-05-12 DIAGNOSIS — R928 Other abnormal and inconclusive findings on diagnostic imaging of breast: Secondary | ICD-10-CM

## 2022-05-14 DIAGNOSIS — Z79899 Other long term (current) drug therapy: Secondary | ICD-10-CM | POA: Diagnosis not present

## 2022-05-14 DIAGNOSIS — R21 Rash and other nonspecific skin eruption: Secondary | ICD-10-CM | POA: Diagnosis not present

## 2022-05-14 DIAGNOSIS — M2550 Pain in unspecified joint: Secondary | ICD-10-CM | POA: Diagnosis not present

## 2022-05-24 ENCOUNTER — Ambulatory Visit
Admission: RE | Admit: 2022-05-24 | Discharge: 2022-05-24 | Disposition: A | Discharge status: Home / Self Care | Payer: BC Managed Care – PPO | Source: Ambulatory Visit | Attending: Family Medicine | Admitting: Family Medicine

## 2022-05-24 ENCOUNTER — Other Ambulatory Visit: Payer: Self-pay | Admitting: Family Medicine

## 2022-05-24 ENCOUNTER — Ambulatory Visit: Payer: BC Managed Care – PPO | Admitting: Neurology

## 2022-05-24 DIAGNOSIS — N6313 Unspecified lump in the right breast, lower outer quadrant: Secondary | ICD-10-CM | POA: Diagnosis not present

## 2022-05-24 DIAGNOSIS — N6310 Unspecified lump in the right breast, unspecified quadrant: Secondary | ICD-10-CM

## 2022-05-24 DIAGNOSIS — R928 Other abnormal and inconclusive findings on diagnostic imaging of breast: Secondary | ICD-10-CM

## 2022-05-24 DIAGNOSIS — R922 Inconclusive mammogram: Secondary | ICD-10-CM | POA: Diagnosis not present

## 2022-05-26 ENCOUNTER — Ambulatory Visit (INDEPENDENT_AMBULATORY_CARE_PROVIDER_SITE_OTHER): Payer: BC Managed Care – PPO | Admitting: Neurology

## 2022-05-26 VITALS — BP 111/70 | HR 90 | Ht 66.0 in | Wt 125.0 lb

## 2022-05-26 DIAGNOSIS — G43709 Chronic migraine without aura, not intractable, without status migrainosus: Secondary | ICD-10-CM | POA: Diagnosis not present

## 2022-05-26 MED ORDER — RIZATRIPTAN BENZOATE 10 MG PO TBDP: 10.0000 mg | ORAL_TABLET | ORAL | 11 refills | Status: DC | PRN

## 2022-05-26 MED ORDER — AJOVY 225 MG/1.5ML ~~LOC~~ SOSY: 225.0000 mg | PREFILLED_SYRINGE | SUBCUTANEOUS | 11 refills | Status: DC

## 2022-05-26 MED ORDER — TOPIRAMATE 50 MG PO TABS: 50.0000 mg | ORAL_TABLET | Freq: Two times a day (BID) | ORAL | 1 refills | Status: DC

## 2022-05-26 NOTE — Progress Notes (Signed)
Patient: Rose Austin Date of Birth: 17-Jun-1970  Reason for Visit: Follow up for migraines History from: Patient Primary Neurologist: Rose Austin  ASSESSMENT AND PLAN 51 y.o. year old female   1.  Chronic migraine headache -Continue Ajovy for migraine preventative, Topamax 50 mg twice a day -Try Maxalt 10 mg melt for insurance approval, this has worked the best -I gave her a sample of Nurtec to try, if better benefit and Roselyn Meier will send in -Previously tried and failed: Excedrin Migraine, Zomig, Imitrex, Maxalt -If migraines continue consider adjusting preventative potentially Emgality for Botox -Follow-up in 6 months or sooner if needed   Rose Austin is a 51 year old female, seen in request by her primary care PA Rose Austin, for evaluation of migraine headaches, initial evaluation was on May 12, 2018.   I have reviewed and summarized the referring note from the referring physician.  She had a past medical history of hypothyroidism, on supplement.   She had a history of migraine headaches since her 29s, trigger for migraines are stress, sleep deprivation, weather changes, around 2017, she was getting frequent migraine headaches, her typical migraine are left-sided severe pounding headache with associated light noise sensitivity, nauseous, she was seen by neurologist, giving preventive medications Topamax 50 mg twice a day since 2018, also use Cefaly device, which has helpful,   For a while she was taking frequent over-the-counter medications ibuprofen, Excedrin Migraine, 20 migraine headache days in 1 month, she brought a migraine journal, she is now having 7-12 migraine days each month, takes Zomig 3-4 times each month as needed, but still taking frequent Excedrin Migraine, ibuprofen,   She has never tried Maxalt in the past, limited response to Imitrex.   UPDATE January 04 2019: She now has migraine 4-6 /month, add on Ajovy has made big difference, previously  she was having 18-20 migraine days each month, her migraine is better controlled with Maxalt,   Update July 11, 2019 SS: Via Virtual visit, her headaches are doing really well, even better than in July. The Ajovy has made a huge difference. In January, she had 3 migraines, before migraine treatment in 2018 having migraines 20 days a month. Is taking Topamax 50 mg BID, Ajovy 225 mg every month, Maxalt as needed. Usually Maxalt works well, sometimes she will treat milder migraines with ibuprofen. She may have to use combination of Maxalt, ibuprofen, phenergan. Tizanidine wasn't really helpful.    Update June 17, 2020 SS: Via VV, headaches doing really well, on average 3-5 a month migraines.  Great benefit with Ajovy, does wonder if she could switch to autoinjector.  Also on Topamax 50 mg twice a day.  For acute headache will take Maxalt, usually works within 20 minutes.  Usually triptan relieves the medication without combine with ibuprofen.  In the last year, only 1 severe headache.  In general do not interfere with function.  Is overall doing really well, used to have 15-20 migraines a month before Ajovy.  Update February 01, 2022 SS: was on dissolving form of Maxalt, insurance stopped covering, insurance requires the pill form, often has to take 2. Still on Ajovy. Takes triptan 5-8 days a month. Headaches are not incapacitating. Can tell when the Ajovy is approaching the next day. Still on Topamax 50 mg BID. Has significant depression, recently diagnosed with inflammatory arthritis   Update May 26, 2022 SS: November had 9 headache days, this month 6 so far. Roselyn Meier worked fairly well but often took 2. Lately under  a lot of stress, likely contributing. Able to continue with function with headache. Remains on Ajovy. About to start Parnate for depression.  Maxalt melt works the best, but insurance would not approve.  REVIEW OF SYSTEMS: Out of a complete 14 system review of symptoms, the patient  complains only of the following symptoms, and all other reviewed systems are negative.  See HPI  ALLERGIES: No Known Allergies  HOME MEDICATIONS: Outpatient Medications Prior to Visit  Medication Sig Dispense Refill   buPROPion (WELLBUTRIN SR) 200 MG 12 hr tablet Take 200 mg by mouth 2 (two) times daily.     cetirizine (ZYRTEC) 10 MG tablet Take 10 mg by mouth daily.     Ciclopirox 1 % shampoo Apply 1 application topically at bedtime.      clobetasol (TEMOVATE) 0.05 % external solution Apply 1 application topically 2 (two) times daily.     clonazePAM (KLONOPIN) 0.5 MG tablet Take 0.25-0.75 mg by mouth See admin instructions. Take 0.41m qam, 0.216mat noon     Coenzyme Q10 (COQ10) 100 MG CAPS Take 100 mg by mouth 2 (two) times daily.     fluocinonide ointment (LIDEX) 0.1.19 Apply 1 application topically 2 (two) times daily as needed (Dermatitis). Hands     ibuprofen (ADVIL) 200 MG tablet Take 200 mg by mouth as needed.     L-Methylfolate (DEPLIN PO) Take 1 tablet by mouth at bedtime.      Multiple Vitamin (MULTIVITAMIN) tablet Take 1 tablet by mouth at bedtime.      Nerve Stimulator (CEFALY KIT) DEVI by Does not apply route daily.      Probiotic Product (PROBIOTIC PO) Take 1 capsule by mouth daily.     promethazine (PHENERGAN) 25 MG tablet ONE TABLET EVERY 6 HOURS AS NEEDED FOR NAUSEA/MIGRAINES. #30 PER MONTH. 30 tablet 2   Sod Fluoride-Potassium Nitrate 1.1-5 % PSTE Place 1 application onto teeth at bedtime.      traZODone (DESYREL) 50 MG tablet Take 25-50 mg by mouth at bedtime.     Ubrogepant (UBRELVY) 100 MG TABS Take 100 mg by mouth as needed (take 1 headache at onset of headache, may repeat in 2 hours if needed, max is 200 mg in 24 hours). 10 tablet 11   Fremanezumab-vfrm (AJOVY) 225 MG/1.5ML SOSY Inject 225 mg into the skin every 30 (thirty) days. 1.5 mL 6   rizatriptan (MAXALT) 10 MG tablet Take 1 tab at onset of migraine. May repeat in 2 hrs, if needed. Max dose: 2 tabs/day. This  is a 30 day prescription. 10 tablet 5   topiramate (TOPAMAX) 50 MG tablet Take 1 tablet (50 mg total) by mouth 2 (two) times daily. 180 tablet 1   ipratropium (ATROVENT) 0.06 % nasal spray Place 1 spray into both nostrils in the morning and at bedtime. 15 mL 5   lithium 300 MG tablet Take 300 mg by mouth daily.     No facility-administered medications prior to visit.    PAST MEDICAL HISTORY: Past Medical History:  Diagnosis Date   [place]   Benign neoplasm of skin    Focal dystonia    Gait disorder    Genetic torsion dystonia    Intractable chronic migraine without aura and without status migrainosus    Iodine hypothyroidism    Irregular menses    Other neutropenia (HCC)    Seborrheic dermatitis    Thrombosed external hemorrhoid     PAST SURGICAL HISTORY: Past Surgical History:  Procedure  Laterality Date   BREAST BIOPSY Left 09/23/2020   HYMENECTOMY      FAMILY HISTORY: Family History  Problem Relation Age of Onset   Dementia Mother    Hypertension Mother    Colon cancer Maternal Grandmother    Lung cancer Paternal Grandfather    Dementia Maternal Grandfather    Hypertension Maternal Grandfather    Breast cancer Paternal Grandmother     SOCIAL HISTORY: Social History   Socioeconomic History   Marital status: Divorced    Spouse name: Not on file   Number of children: 0   Years of education: college   Highest education level: Master's degree (e.g., MA, MS, MEng, MEd, MSW, MBA)  Occupational History   Occupation: Does not work  Tobacco Use   Smoking status: Never   Smokeless tobacco: Never  Vaping Use   Vaping Use: Never used  Substance and Sexual Activity   Alcohol use: Yes    Comment: 1-2 drinks per month   Drug use: Never   Sexual activity: Yes  Other Topics Concern   Not on file  Social History Narrative   Lives at home alone.   Right-handed.   1 cup caffeine per day.   Social Determinants of Health   Financial  Resource Strain: Not on file  Food Insecurity: Not on file  Transportation Needs: Not on file  Physical Activity: Not on file  Stress: Not on file  Social Connections: Not on file  Intimate Partner Violence: Not on file   PHYSICAL EXAM  Vitals:   05/26/22 0948  BP: 111/70  Pulse: 90  Weight: 125 lb (56.7 kg)  Height: _0  (1.676 m)    Body mass index is 20.18 kg/m.  Generalized: Well developed, in no acute distress  Neurological examination  Mentation: Alert oriented to time, place, history taking. Follows all commands speech and language fluent Cranial nerve II-XII: Pupils were equal round reactive to light. Extraocular movements were full, visual field were full on confrontational test. Facial sensation and strength were normal. Head turning and shoulder shrug  were normal and symmetric. Motor: The motor testing reveals 5 over 5 strength of all 4 extremities. Good symmetric motor tone is noted throughout.  Sensory: Sensory testing is intact to soft touch on all 4 extremities. No evidence of extinction is noted.  Coordination: Cerebellar testing reveals good finger-nose-finger and heel-to-shin bilaterally.  Gait and station: Gait is normal.   DIAGNOSTIC DATA (LABS, IMAGING, TESTING) - I reviewed patient records, labs, notes, testing and imaging myself where available.  Lab Results  Component Value Date   WBC 5.0 01/22/2020   HGB 13.6 01/22/2020   HCT 39.3 01/22/2020   MCV 87 01/22/2020   PLT 202 01/22/2020      Component Value Date/Time   NA 139 12/08/2018 1134   K 4.5 12/08/2018 1134   CL 106 12/08/2018 1134   CO2 25 12/08/2018 1134   GLUCOSE 92 12/08/2018 1134   BUN 13 12/08/2018 1134   CREATININE 0.97 12/08/2018 1134   CREATININE 0.80 05/23/2018 1241   CALCIUM 9.0 12/08/2018 1134   PROT 6.1 (L) 12/08/2018 1134   ALBUMIN 4.2 12/08/2018 1134   AST 20 12/08/2018 1134   AST 19 05/23/2018 1241   ALT 18 12/08/2018 1134   ALT 20 05/23/2018 1241   ALKPHOS 49  12/08/2018 1134   BILITOT 0.6 12/08/2018 1134   BILITOT 0.4 05/23/2018 1241   GFRNONAA >60 12/08/2018 1134   GFRNONAA >60 05/23/2018 1241   GFRAA >  60 12/08/2018 1134   GFRAA >60 05/23/2018 1241   No results found for: "CHOL", "HDL", "LDLCALC", "LDLDIRECT", "TRIG", "CHOLHDL" No results found for: "HGBA1C" Lab Results  Component Value Date   VITAMINB12 1,048 02/01/2019   No results found for: "TSH"  Butler Denmark, AGNP-C, DNP 05/26/2022, 10:19 AM [facility] [address] [place], [place] [address] [phone]

## 2022-05-26 NOTE — Patient Instructions (Signed)
We will try to get Maxalt melt approved, try the Nurtec sample instead of Ubrelvy for acute migraine treatment

## 2022-05-27 DIAGNOSIS — F332 Major depressive disorder, recurrent severe without psychotic features: Secondary | ICD-10-CM | POA: Diagnosis not present

## 2022-05-28 DIAGNOSIS — G249 Dystonia, unspecified: Secondary | ICD-10-CM | POA: Diagnosis not present

## 2022-06-01 ENCOUNTER — Ambulatory Visit: Payer: BC Managed Care – PPO | Admitting: Neurology

## 2022-06-03 DIAGNOSIS — F332 Major depressive disorder, recurrent severe without psychotic features: Secondary | ICD-10-CM | POA: Diagnosis not present

## 2022-06-08 DIAGNOSIS — U071 COVID-19: Secondary | ICD-10-CM | POA: Diagnosis not present

## 2022-06-24 DIAGNOSIS — F332 Major depressive disorder, recurrent severe without psychotic features: Secondary | ICD-10-CM | POA: Diagnosis not present

## 2022-06-25 ENCOUNTER — Other Ambulatory Visit: Payer: Self-pay | Admitting: Neurology

## 2022-06-29 DIAGNOSIS — F332 Major depressive disorder, recurrent severe without psychotic features: Secondary | ICD-10-CM | POA: Diagnosis not present

## 2022-07-28 DIAGNOSIS — F332 Major depressive disorder, recurrent severe without psychotic features: Secondary | ICD-10-CM | POA: Diagnosis not present

## 2022-07-29 DIAGNOSIS — F3341 Major depressive disorder, recurrent, in partial remission: Secondary | ICD-10-CM | POA: Diagnosis not present

## 2022-08-03 DIAGNOSIS — F332 Major depressive disorder, recurrent severe without psychotic features: Secondary | ICD-10-CM | POA: Diagnosis not present

## 2022-08-10 DIAGNOSIS — F332 Major depressive disorder, recurrent severe without psychotic features: Secondary | ICD-10-CM | POA: Diagnosis not present

## 2022-08-17 DIAGNOSIS — F332 Major depressive disorder, recurrent severe without psychotic features: Secondary | ICD-10-CM | POA: Diagnosis not present

## 2022-08-19 DIAGNOSIS — G248 Other dystonia: Secondary | ICD-10-CM | POA: Diagnosis not present

## 2022-08-20 ENCOUNTER — Other Ambulatory Visit: Payer: Self-pay

## 2022-08-20 ENCOUNTER — Emergency Department (HOSPITAL_COMMUNITY)
Admission: EM | Admit: 2022-08-20 | Discharge: 2022-08-20 | Disposition: A | Discharge status: Home / Self Care | Payer: BC Managed Care – PPO | Attending: Emergency Medicine | Admitting: Emergency Medicine

## 2022-08-20 ENCOUNTER — Emergency Department (HOSPITAL_COMMUNITY): Payer: BC Managed Care – PPO

## 2022-08-20 ENCOUNTER — Encounter (HOSPITAL_COMMUNITY): Payer: Self-pay

## 2022-08-20 DIAGNOSIS — R404 Transient alteration of awareness: Secondary | ICD-10-CM | POA: Diagnosis not present

## 2022-08-20 DIAGNOSIS — R4182 Altered mental status, unspecified: Secondary | ICD-10-CM | POA: Diagnosis not present

## 2022-08-20 DIAGNOSIS — R9431 Abnormal electrocardiogram [ECG] [EKG]: Secondary | ICD-10-CM | POA: Diagnosis not present

## 2022-08-20 DIAGNOSIS — F068 Other specified mental disorders due to known physiological condition: Secondary | ICD-10-CM | POA: Diagnosis not present

## 2022-08-20 HISTORY — DX: Depression, unspecified: F32.A

## 2022-08-20 HISTORY — DX: Anxiety disorder, unspecified: F41.9

## 2022-08-20 HISTORY — DX: Hypotension, unspecified: I95.9

## 2022-08-20 LAB — I-STAT VENOUS BLOOD GAS, ED
Acid-Base Excess: 2 mmol/L (ref 0.0–2.0)
Bicarbonate: 29.6 mmol/L — ABNORMAL HIGH (ref 20.0–28.0)
Calcium, Ion: 1.26 mmol/L (ref 1.15–1.40)
HCT: 34 % — ABNORMAL LOW (ref 36.0–46.0)
Hemoglobin: 11.6 g/dL — ABNORMAL LOW (ref 12.0–15.0)
O2 Saturation: 50 %
Potassium: 3.4 mmol/L — ABNORMAL LOW (ref 3.5–5.1)
Sodium: 140 mmol/L (ref 135–145)
TCO2: 31 mmol/L (ref 22–32)
pCO2, Ven: 60 mmHg (ref 44–60)
pH, Ven: 7.301 (ref 7.25–7.43)
pO2, Ven: 30 mmHg — CL (ref 32–45)

## 2022-08-20 LAB — CBC WITH DIFFERENTIAL/PLATELET
Abs Immature Granulocytes: 0 10*3/uL (ref 0.00–0.07)
Basophils Absolute: 0 10*3/uL (ref 0.0–0.1)
Basophils Relative: 1 %
Eosinophils Absolute: 0.1 10*3/uL (ref 0.0–0.5)
Eosinophils Relative: 5 %
HCT: 36.3 % (ref 36.0–46.0)
Hemoglobin: 12.2 g/dL (ref 12.0–15.0)
Immature Granulocytes: 0 %
Lymphocytes Relative: 32 %
Lymphs Abs: 0.9 10*3/uL (ref 0.7–4.0)
MCH: 30.7 pg (ref 26.0–34.0)
MCHC: 33.6 g/dL (ref 30.0–36.0)
MCV: 91.4 fL (ref 80.0–100.0)
Monocytes Absolute: 0.4 10*3/uL (ref 0.1–1.0)
Monocytes Relative: 13 %
Neutro Abs: 1.4 10*3/uL — ABNORMAL LOW (ref 1.7–7.7)
Neutrophils Relative %: 49 %
Platelets: 159 10*3/uL (ref 150–400)
RBC: 3.97 MIL/uL (ref 3.87–5.11)
RDW: 12.2 % (ref 11.5–15.5)
WBC: 2.8 10*3/uL — ABNORMAL LOW (ref 4.0–10.5)
nRBC: 0 % (ref 0.0–0.2)

## 2022-08-20 LAB — COMPREHENSIVE METABOLIC PANEL
ALT: 13 U/L (ref 0–44)
AST: 19 U/L (ref 15–41)
Albumin: 4.1 g/dL (ref 3.5–5.0)
Alkaline Phosphatase: 56 U/L (ref 38–126)
Anion gap: 6 (ref 5–15)
BUN: 12 mg/dL (ref 6–20)
CO2: 26 mmol/L (ref 22–32)
Calcium: 8.9 mg/dL (ref 8.9–10.3)
Chloride: 103 mmol/L (ref 98–111)
Creatinine, Ser: 1.01 mg/dL — ABNORMAL HIGH (ref 0.44–1.00)
GFR, Estimated: >60 mL/min (ref >60)
Glucose, Bld: 93 mg/dL (ref 70–99)
Potassium: 3.4 mmol/L — ABNORMAL LOW (ref 3.5–5.1)
Sodium: 135 mmol/L (ref 135–145)
Total Bilirubin: 0.6 mg/dL (ref 0.3–1.2)
Total Protein: 6.1 g/dL — ABNORMAL LOW (ref 6.5–8.1)

## 2022-08-20 LAB — URINALYSIS, W/ REFLEX TO CULTURE (INFECTION SUSPECTED)
Bilirubin Urine: NEGATIVE
Glucose, UA: NEGATIVE mg/dL
Hgb urine dipstick: NEGATIVE
Ketones, ur: NEGATIVE mg/dL
Leukocytes,Ua: NEGATIVE
Nitrite: NEGATIVE
Protein, ur: NEGATIVE mg/dL
Specific Gravity, Urine: 1.009 (ref 1.005–1.030)
pH: 6 (ref 5.0–8.0)

## 2022-08-20 LAB — RAPID URINE DRUG SCREEN, HOSP PERFORMED
Amphetamines: NOT DETECTED
Barbiturates: NOT DETECTED
Benzodiazepines: NOT DETECTED
Cocaine: NOT DETECTED
Opiates: NOT DETECTED
Tetrahydrocannabinol: NOT DETECTED

## 2022-08-20 MED ORDER — SODIUM CHLORIDE 0.9 % IV BOLUS
1000.0000 mL | Freq: Once | INTRAVENOUS | Status: AC
  Administered 2022-08-20: 1000 mL via INTRAVENOUS

## 2022-08-20 MED ORDER — SODIUM CHLORIDE 0.9 % IV SOLN: INTRAVENOUS | Status: DC

## 2022-08-20 NOTE — ED Notes (Signed)
Pt resting, NAD noted, observed even RR and unlabored, side rails up x2 for safety, plan of care ongoing, pt expresses no needs or concerns at this time, call light within reach, no further concerns as of present.  

## 2022-08-20 NOTE — ED Triage Notes (Signed)
Pt from home BIB [facility] d/t confusion, roommate was concern as pt has been "acting weird" since 16:30 this afternoon, pt went to botox appt at 1 pm. Got home around 4 pm. Pt took her dog out to potty around 19:45, roommate got concern that pt had not return, text her at 23:00, pt return back home after 23:30. Pt was unable to remember taking the dog out or why she was gone for so long. Roommate also reports pt has not slept in almost 16 hours.   EMS reports negative stroke screen, answer all questions approprietly and follow commands, just confused on events tonight. Pt has had a lot of stress lately, mother in hospice and dog needs surgery. Pt also on several psych meds, unsure if she is taking correctly per roommate.   Pt alert on arrival, able to walk to bed from EMS stretcher, steady gait noted.    LKN 3/13 at 11:00am per roommate that last seen her prior to tonight

## 2022-08-20 NOTE — ED Notes (Signed)
Pt ambulated to BR and back to treatment room with steady gait

## 2022-08-20 NOTE — ED Notes (Signed)
Pt given ice water to drink

## 2022-08-20 NOTE — ED Provider Notes (Signed)
[facility] Provider Note  CSN: [id number] Arrival date & time: 08/20/22 0232  Chief Complaint(s) Altered Mental Status ED Triage Notes Wynema Birch, RN (Registered Nurse)  Emergency Medicine  Date of Service: 08/20/2022  2:32 AM  Signed  Pt from home BIB [facility] d/t confusion, roommate was concern as pt has been "acting weird" since 16:30 this afternoon, pt went to botox appt at 1 pm. Got home around 4 pm. Pt took her dog out to potty around 19:45, roommate got concern that pt had not return, text her at 23:00, pt return back home after 23:30. Pt was unable to remember taking the dog out or why she was gone for so long. Roommate also reports pt has not slept in almost 16 hours.    EMS reports negative stroke screen, answer all questions approprietly and follow commands, just confused on events tonight. Pt has had a lot of stress lately, mother in hospice and dog needs surgery. Pt also on several psych meds, unsure if she is taking correctly per roommate.    Pt alert on arrival, able to walk to bed from EMS stretcher, steady gait noted.   HPI Rose Austin is a 52 y.o. female with PMH listed below here for AMS. Patient does not remember detail for this afternoon. Reports difficulty sleeping for the past couple of days despite taking her Rx'd  Trazadone. Denies taking extra doses. She is also on  several psych meds that she states she has not abused. She denies etoh use. No other ilicit drug use. No recent infection, N/V/D. No focal weakness or loss of sensation.   The history is provided by the patient.    Past Medical History Past Medical History:  Diagnosis Date   Anterior pituitary hyperfunction (HCC)    Anxiety    Benign neoplasm of skin    Depression    Focal dystonia    Gait disorder    Genetic torsion dystonia    Hypotension    Intractable chronic migraine without aura and without status migrainosus    Iodine hypothyroidism     Irregular menses    Other neutropenia (HCC)    Seborrheic dermatitis    Thrombosed external hemorrhoid    Patient Active Problem List   Diagnosis Date Noted   Recurrent UTI 03/02/2019   Lymphopenia 03/02/2019   Low memory T cell count determined by flow cytometry 03/02/2019   Low immunoglobulin level 05/23/2018   Chronic migraine w/o aura w/o status migrainosus, not intractable 05/12/2018   Home Medication(s) Prior to Admission medications   Medication Sig Start Date End Date Taking? Authorizing Provider  buPROPion (WELLBUTRIN SR) 200 MG 12 hr tablet Take 200 mg by mouth 2 (two) times daily.    [provider]  cetirizine (ZYRTEC) 10 MG tablet Take 10 mg by mouth daily.    [provider]  Ciclopirox 1 % shampoo Apply 1 application topically at bedtime.     [provider]  clobetasol (TEMOVATE) 0.05 % external solution Apply 1 application topically 2 (two) times daily.    [provider]  clonazePAM (KLONOPIN) 0.5 MG tablet Take 0.25-0.75 mg by mouth See admin instructions. Take 0.75mg  qam, 0.25mg  at noon    [provider]  Coenzyme Q10 (COQ10) 100 MG CAPS Take 100 mg by mouth 2 (two) times daily.    [provider]  fluocinonide ointment (LIDEX) [id number] % Apply 1 application topically 2 (two) times daily as needed (Dermatitis).  Hands    [provider]  Fremanezumab-vfrm (AJOVY) 225 MG/1.5ML SOSY Inject 225 mg into the skin every 30 (thirty) days. 05/26/22   Suzzanne Cloud, NP  ibuprofen (ADVIL) 200 MG tablet Take 200 mg by mouth as needed.    [provider]  L-Methylfolate (DEPLIN PO) Take 1 tablet by mouth at bedtime.     [provider]  Multiple Vitamin (MULTIVITAMIN) tablet Take 1 tablet by mouth at bedtime.     [provider]  Nerve Stimulator (CEFALY KIT) DEVI by Does not apply route daily.     [provider]  Probiotic Product (PROBIOTIC PO) Take 1 capsule by mouth daily.     [provider]  promethazine (PHENERGAN) 25 MG tablet ONE TABLET EVERY 6 HOURS AS NEEDED FOR NAUSEA/MIGRAINES. #30 PER MONTH. 05/14/22   Marcial Pacas, MD  rizatriptan (MAXALT-MLT) 10 MG disintegrating tablet Take 1 tablet (10 mg total) by mouth as needed for migraine. May repeat in 2 hours if needed 05/26/22   Suzzanne Cloud, NP  Sod Fluoride-Potassium Nitrate 1.1-5 % PSTE Place 1 application onto teeth at bedtime.     [provider]  topiramate (TOPAMAX) 50 MG tablet Take 1 tablet (50 mg total) by mouth 2 (two) times daily. 05/26/22   Suzzanne Cloud, NP  traZODone (DESYREL) 50 MG tablet Take 25-50 mg by mouth at bedtime.    [provider]  Ubrogepant (UBRELVY) 100 MG TABS Take 100 mg by mouth as needed (take 1 headache at onset of headache, may repeat in 2 hours if needed, max is 200 mg in 24 hours). 02/01/22   Suzzanne Cloud, NP                                                                                                                                    Allergies Patient has no known allergies.  Review of Systems Review of Systems As noted in HPI  Physical Exam Vital Signs  I have reviewed the triage vital signs BP 135/83   Pulse (!) 51   Temp (!) 97.4 F (36.3 C) (Oral)   Resp 10   Ht 5' 5.5" (1.664 m)   Wt 56.7 kg   SpO2 100%   BMI 20.48 kg/m   Physical Exam Vitals reviewed.  Constitutional:      General: She is not in acute distress.    Appearance: She is well-developed. She is not diaphoretic.  HENT:     Head: Normocephalic and atraumatic.     Nose: Nose normal.     Mouth/Throat:     Mouth: Mucous membranes are dry.  Eyes:     General: No scleral icterus.       Right eye: No discharge.        Left eye: No discharge.     Conjunctiva/sclera: Conjunctivae normal.     Pupils: Pupils are equal, round, and reactive to  light.  Cardiovascular:     Rate and Rhythm: Normal rate and regular rhythm.     Heart sounds: No murmur heard.    No  friction rub. No gallop.  Pulmonary:     Effort: Pulmonary effort is normal. No respiratory distress.     Breath sounds: Normal breath sounds. No stridor. No rales.  Abdominal:     General: There is no distension.     Palpations: Abdomen is soft.     Tenderness: There is no abdominal tenderness.  Musculoskeletal:        General: No tenderness.     Cervical back: Normal range of motion and neck supple.  Skin:    General: Skin is warm and dry.     Findings: No erythema or rash.  Neurological:     Mental Status: She is alert and oriented to person, place, and time.     Comments: Mental Status:  Alert and oriented to person, place, and time.  Attention and concentration slowed.  Speech clear.  Recent memory is intact  Cranial Nerves:  II Visual Fields: Intact to confrontation. Visual fields intact. III, IV, VI: Pupils equal and reactive to light and near. Full eye movement without nystagmus  V Facial Sensation: Normal. No weakness of masticatory muscles  VII: No facial weakness or asymmetry  VIII Auditory Acuity: Grossly normal  IX/X: The uvula is midline; the palate elevates symmetrically  XI: Normal sternocleidomastoid and trapezius strength  XII: The tongue is midline. No atrophy or fasciculations.   Motor System: Muscle Strength: 5/5 and symmetric in the upper and lower extremities. No pronation or drift.  Muscle Tone: Tone and muscle bulk are normal in the upper and lower extremities.  Coordination: Intact finger-to-nose. No tremor.  Sensation: Intact to light touch. Gait: Routine gait normal.      ED Results and Treatments Labs (all labs ordered are listed, but only abnormal results are displayed) Labs Reviewed  COMPREHENSIVE METABOLIC PANEL - Abnormal; Notable for the following components:      Result Value   Potassium 3.4 (*)    Creatinine, Ser 1.01 (*)    Total Protein 6.1 (*)    All other components within normal limits  CBC WITH DIFFERENTIAL/PLATELET -  Abnormal; Notable for the following components:   WBC 2.8 (*)    Neutro Abs 1.4 (*)    All other components within normal limits  URINALYSIS, W/ REFLEX TO CULTURE (INFECTION SUSPECTED) - Abnormal; Notable for the following components:   Bacteria, UA RARE (*)    All other components within normal limits  I-STAT VENOUS BLOOD GAS, ED - Abnormal; Notable for the following components:   pO2, Ven 30 (*)    Bicarbonate 29.6 (*)    Potassium 3.4 (*)    HCT 34.0 (*)    Hemoglobin 11.6 (*)    All other components within normal limits  RAPID URINE DRUG SCREEN, HOSP PERFORMED  CBG MONITORING, ED  EKG  EKG Interpretation  Date/Time:  Friday August 20 2022 04:43:01 EDT Ventricular Rate:  60 PR Interval:  146 QRS Duration: 101 QT Interval:  449 QTC Calculation: 449 R Axis:   89 Text Interpretation: Sinus rhythm RSR' in V1 or V2, probably normal variant Borderline T abnormalities, anterior leads No old tracing to compare Confirmed by Addison Lank [phone]) on 08/20/2022 4:47:15 AM       Radiology CT Head Wo Contrast  Result Date: 08/20/2022 CLINICAL DATA:  Mental status change with unknown cause. EXAM: CT HEAD WITHOUT CONTRAST TECHNIQUE: Contiguous axial images were obtained from the base of the skull through the vertex without intravenous contrast. RADIATION DOSE REDUCTION: This exam was performed according to the departmental dose-optimization program which includes automated exposure control, adjustment of the mA and/or kV according to patient size and/or use of iterative reconstruction technique. COMPARISON:  None Available. FINDINGS: Brain: No evidence of acute infarction, hemorrhage, hydrocephalus, extra-axial collection or mass lesion/mass effect. Vascular: No hyperdense vessel or unexpected calcification. Skull: Normal. Negative for fracture or focal lesion. Sinuses/Orbits:  No acute finding. Other: Mild motion artifact with image blurring. IMPRESSION: Negative head CT. Electronically Signed   By: Jorje Guild M.D.   On: 08/20/2022 07:20    Medications Ordered in ED Medications  sodium chloride 0.9 % bolus 1,000 mL (0 mLs Intravenous Stopped 08/20/22 0537)    And  0.9 %  sodium chloride infusion ( Intravenous New Bag/Given 08/20/22 0538)                                                                                                                                     Procedures Procedures  (including critical care time)  Medical Decision Making / ED Course  Click here for ABCD2, HEART and other calculators  Medical Decision Making Amount and/or Complexity of Data Reviewed Labs: ordered. Decision-making details documented in ED Course. Radiology: ordered. ECG/medicine tests: ordered and independent interpretation performed. Decision-making details documented in ED Course.  Risk Prescription drug management.    AMS Differential includes but not limited to metabolic/electrolyte derangements, infectious process, medication side effect/polypharmacy. Also considering sleep deprivation. Will also assess for brain mass. Doubt ICH or meningitis.  Possible transient global amnesia.  CBC without leukocytosis or anemia Metabolic panel without significant electrolyte derangements or renal insufficiency. Bilirubin normal No transaminitis No hypercarbia UA without infection UDS negative  CT head negative.      Final Clinical Impression(s) / ED Diagnoses Final diagnoses:  Transient alteration of awareness           This chart was dictated using voice recognition software.  Despite best efforts to proofread,  errors can occur which can change the documentation meaning.  The patient appears reasonably screened and/or stabilized for discharge and I doubt any other medical condition or other [facility] requiring further screening, evaluation, or treatment  in the ED at this time. I have discussed the findings,  Dx and Tx plan with the patient/family who expressed understanding and agree(s) with the plan. Discharge instructions discussed at length. The patient/family was given strict return precautions who verbalized understanding of the instructions. No further questions at time of discharge.  Disposition: Discharge  Condition: Good  ED Discharge Orders     None         Follow Up: Marda Stalker, PA-C [place] [address] [phone]  Call  to schedule an appointment for close follow up      Fatima Blank, MD 08/20/22 [phone]

## 2022-08-24 DIAGNOSIS — R410 Disorientation, unspecified: Secondary | ICD-10-CM | POA: Diagnosis not present

## 2022-08-24 DIAGNOSIS — E039 Hypothyroidism, unspecified: Secondary | ICD-10-CM | POA: Diagnosis not present

## 2022-09-03 ENCOUNTER — Other Ambulatory Visit: Payer: Self-pay | Admitting: Neurology

## 2022-09-07 ENCOUNTER — Other Ambulatory Visit: Payer: Self-pay | Admitting: Neurology

## 2022-09-14 DIAGNOSIS — F332 Major depressive disorder, recurrent severe without psychotic features: Secondary | ICD-10-CM | POA: Diagnosis not present

## 2022-09-27 DIAGNOSIS — F331 Major depressive disorder, recurrent, moderate: Secondary | ICD-10-CM | POA: Diagnosis not present

## 2022-09-27 DIAGNOSIS — F5101 Primary insomnia: Secondary | ICD-10-CM | POA: Diagnosis not present

## 2022-09-28 DIAGNOSIS — F332 Major depressive disorder, recurrent severe without psychotic features: Secondary | ICD-10-CM | POA: Diagnosis not present

## 2022-10-05 DIAGNOSIS — F332 Major depressive disorder, recurrent severe without psychotic features: Secondary | ICD-10-CM | POA: Diagnosis not present

## 2022-10-05 DIAGNOSIS — E039 Hypothyroidism, unspecified: Secondary | ICD-10-CM | POA: Insufficient documentation

## 2022-10-05 DIAGNOSIS — F32A Depression, unspecified: Secondary | ICD-10-CM | POA: Insufficient documentation

## 2022-10-05 DIAGNOSIS — F419 Anxiety disorder, unspecified: Secondary | ICD-10-CM | POA: Insufficient documentation

## 2022-10-06 ENCOUNTER — Other Ambulatory Visit: Payer: Self-pay | Admitting: Neurology

## 2022-10-07 ENCOUNTER — Institutional Professional Consult (permissible substitution): Payer: BC Managed Care – PPO | Admitting: Neurology

## 2022-10-07 ENCOUNTER — Ambulatory Visit (INDEPENDENT_AMBULATORY_CARE_PROVIDER_SITE_OTHER): Payer: BC Managed Care – PPO | Admitting: Neurology

## 2022-10-07 ENCOUNTER — Encounter: Payer: Self-pay | Admitting: Neurology

## 2022-10-07 VITALS — BP 110/72 | HR 82 | Ht 66.0 in | Wt 128.5 lb

## 2022-10-07 DIAGNOSIS — E039 Hypothyroidism, unspecified: Secondary | ICD-10-CM | POA: Diagnosis not present

## 2022-10-07 DIAGNOSIS — G43709 Chronic migraine without aura, not intractable, without status migrainosus: Secondary | ICD-10-CM | POA: Diagnosis not present

## 2022-10-07 DIAGNOSIS — R700 Elevated erythrocyte sedimentation rate: Secondary | ICD-10-CM | POA: Diagnosis not present

## 2022-10-07 DIAGNOSIS — R799 Abnormal finding of blood chemistry, unspecified: Secondary | ICD-10-CM | POA: Diagnosis not present

## 2022-10-07 DIAGNOSIS — R7982 Elevated C-reactive protein (CRP): Secondary | ICD-10-CM | POA: Diagnosis not present

## 2022-10-07 MED ORDER — PROMETHAZINE HCL 25 MG PO TABS: 25.0000 mg | ORAL_TABLET | Freq: Three times a day (TID) | ORAL | 5 refills | Status: DC | PRN

## 2022-10-07 NOTE — Progress Notes (Signed)
ASSESSMENT AND PLAN 52 y.o. year old female   Chronic migraine headache  Ajovy for migraine preventative, Topamax 50 mg twice a day Maxalt 10 mg, phenergan prn Nurtec to try,  prn Previously tried and failed: Excedrin Migraine, Zomig, Imitrex, Maxalt     Worsening memory loss, transient confusion episode, in the setting of worsening mood disorder  MRI of the brain to rule out structural abnormality  Laboratory evaluations to rule out treatable etiology  EEG   HISTORY: Rose Austin is a 52 year old female, seen in request by her primary care PA Jarrett Soho, for evaluation of migraine headaches, initial evaluation was on May 12, 2018.   I have reviewed and summarized the referring note from the referring physician.  She had a past medical history of hypothyroidism, on supplement.   She had a history of migraine headaches since her 73s, trigger for migraines are stress, sleep deprivation, weather changes, around 2017, she was getting frequent migraine headaches, her typical migraine are left-sided severe pounding headache with associated light noise sensitivity, nauseous, she was seen by neurologist, giving preventive medications Topamax 50 mg twice a day since 2018, also use Cefaly device, which has helpful,   For a while she was taking frequent over-the-counter medications ibuprofen, Excedrin Migraine, 20 migraine headache days in 1 month, she brought a migraine journal, she is now having 7-12 migraine days each month, takes Zomig 3-4 times each month as needed, but still taking frequent Excedrin Migraine, ibuprofen,   She has never tried Maxalt in the past, limited response to Imitrex.   UPDATE January 04 2019: She now has migraine 4-6 /month, add on Ajovy has made big difference, previously she was having 18-20 migraine days each month, her migraine is better controlled with Maxalt,  UPDATE May 2nd 2024: She had transient memory loss on March 15th, lasting for 5  hours, witnessed by her roommate, presented to ER,   Roommate woke up from his nap, found she was not at home, she was lost for few hours, would not pick up her phone, she was walking her dog for few hours, but has no memory of it, kept on repeating herself,  acting strangely.  Presented to ER, CT head was normal.  Labs, UDS was negative, WBC was low 2.8, Hg 12.2, CMP was normal.  She was receiving BOTOX  injection to her right calf at [facility] by Dr. Lieutenant Diego for right lower extremity focal dystonia, last injection was on August 18 2021, used BOTOX 450 units.  She complains of excessive stress.  Recent TSH was 8.33, see endocrinologist,   REVIEW OF SYSTEMS: Out of a complete 14 system review of symptoms, the patient complains only of the following symptoms, and all other reviewed systems are negative.  See HPI  PHYSICAL EXAM  Vitals:   10/07/22 1333  BP: 110/72  Pulse: 82  Weight: 128 lb 8 oz (58.3 kg)  Height: 5\' 6"  (1.676 m)    Body mass index is 20.74 kg/m.   PHYSICAL EXAMNIATION:  Gen: NAD, conversant, well nourised, well groomed                     Cardiovascular: Regular rate rhythm, no peripheral edema, warm, nontender. Eyes: Conjunctivae clear without exudates or hemorrhage Neck: Supple, no carotid bruits. Pulmonary: Clear to auscultation bilaterally   NEUROLOGICAL EXAM:  MENTAL STATUS: Speech/cognition: Awake, alert oriented to history taking and casual conversation  CRANIAL NERVES: CN II: Visual fields are full to  confrontation.  Pupils are round equal and briskly reactive to light. CN III, IV, VI: extraocular movement are normal. No ptosis. CN V: Facial sensation is intact to pinprick in all 3 divisions bilaterally. Corneal responses are intact.  CN VII: Face is symmetric with normal eye closure and smile. CN VIII: Hearing is normal to casual conversation CN IX, X: Palate elevates symmetrically. Phonation is normal. CN XI: Head turning and  shoulder shrug are intact CN XII: Tongue is midline with normal movements and no atrophy.  MOTOR: There is no pronator drift of out-stretched arms. Muscle bulk and tone are normal. Muscle strength is normal.  REFLEXES: Reflexes are 2+ and symmetric at the biceps, triceps, knees, and ankles. Plantar responses are flexor.  SENSORY: Intact to light touch, pinprick, positional and vibratory sensation are intact in fingers and toes.  COORDINATION: Rapid alternating movements and fine finger movements are intact. There is no dysmetria on finger-to-nose and heel-knee-shin.    GAIT/STANCE: steady  ALLERGIES: No Known Allergies  HOME MEDICATIONS: Outpatient Medications Prior to Visit  Medication Sig Dispense Refill   buPROPion (WELLBUTRIN SR) 200 MG 12 hr tablet Take 200 mg by mouth 2 (two) times daily.     cetirizine (ZYRTEC) 10 MG tablet Take 10 mg by mouth daily.     Ciclopirox 1 % shampoo Apply 1 application topically at bedtime.      clobetasol (TEMOVATE) 0.05 % external solution Apply 1 application topically 2 (two) times daily.     clonazePAM (KLONOPIN) 0.5 MG tablet Take 0.25-0.75 mg by mouth See admin instructions. Take 0.75mg  qam, 0.25mg  at noon     Coenzyme Q10 (COQ10) 100 MG CAPS Take 100 mg by mouth 2 (two) times daily.     Esketamine HCl, 84 MG Dose, (SPRAVATO, 84 MG DOSE,) 28 MG/DEVICE SOPK Place into the nose.     fluocinonide ointment (LIDEX) 0.05 % Apply 1 application topically 2 (two) times daily as needed (Dermatitis). Hands     Fremanezumab-vfrm (AJOVY) 225 MG/1.5ML SOSY Inject 225 mg into the skin every 30 (thirty) days. 1.5 mL 11   hydroxychloroquine (PLAQUENIL) 200 MG tablet Take 300 mg by mouth daily.     ibuprofen (ADVIL) 200 MG tablet Take 200 mg by mouth as needed.     L-Methylfolate (DEPLIN PO) Take 1 tablet by mouth at bedtime.      levothyroxine (SYNTHROID) 100 MCG tablet Take 100 mcg by mouth daily before breakfast. Take 1 tab daily except 1.5 tablet on  Monday and Wednesday     Multiple Vitamin (MULTIVITAMIN) tablet Take 1 tablet by mouth at bedtime.      Nerve Stimulator (CEFALY KIT) DEVI by Does not apply route daily.      Probiotic Product (PROBIOTIC PO) Take 1 capsule by mouth daily.     promethazine (PHENERGAN) 25 MG tablet ONE TABLET EVERY 6 HOURS AS NEEDED FOR NAUSEA/MIGRAINES. #30 PER MONTH. 30 tablet 2   rizatriptan (MAXALT-MLT) 10 MG disintegrating tablet Take 1 tablet (10 mg total) by mouth as needed for migraine. May repeat in 2 hours if needed 9 tablet 11   Sod Fluoride-Potassium Nitrate 1.1-5 % PSTE Place 1 application onto teeth at bedtime.      topiramate (TOPAMAX) 50 MG tablet Take 1 tablet (50 mg total) by mouth 2 (two) times daily. 180 tablet 1   tranylcypromine (PARNATE) 10 MG tablet Take 10 mg by mouth 3 (three) times daily.     traZODone (DESYREL) 50 MG tablet Take 25-50 mg by mouth  at bedtime.     Ubrogepant (UBRELVY) 100 MG TABS Take 100 mg by mouth as needed (take 1 headache at onset of headache, may repeat in 2 hours if needed, max is 200 mg in 24 hours). 10 tablet 11   rizatriptan (MAXALT) 10 MG tablet TAKE 1 TAB AT ONSET OF MIGRAINE. MAY REPEAT IN 2 HRS, IF NEEDED. MAX DOSE: 2 TABS/DAY. THIS IS A 30 DAY PRESCRIPTION. 10 tablet 5   No facility-administered medications prior to visit.    PAST MEDICAL HISTORY: Past Medical History:  Diagnosis Date   Anterior pituitary hyperfunction (HCC)    Anxiety    Benign neoplasm of skin    Depression    Focal dystonia    Gait disorder    Genetic torsion dystonia    Hypotension    Intractable chronic migraine without aura and without status migrainosus    Iodine hypothyroidism    Irregular menses    Other neutropenia (HCC)    Seborrheic dermatitis    Thrombosed external hemorrhoid     PAST SURGICAL HISTORY: Past Surgical History:  Procedure Laterality Date   BREAST BIOPSY Left 09/23/2020   HYMENECTOMY      FAMILY HISTORY: Family History  Problem Relation Age  of Onset   Dementia Mother    Hypertension Mother    Colon cancer Maternal Grandmother    Lung cancer Paternal Grandfather    Dementia Maternal Grandfather    Hypertension Maternal Grandfather    Breast cancer Paternal Grandmother     SOCIAL HISTORY: Social History   Socioeconomic History   Marital status: Divorced    Spouse name: Not on file   Number of children: 0   Years of education: college   Highest education level: Master's degree (e.g., MA, MS, MEng, MEd, MSW, MBA)  Occupational History   Occupation: Does not work  Tobacco Use   Smoking status: Never   Smokeless tobacco: Never  Vaping Use   Vaping Use: Never used  Substance and Sexual Activity   Alcohol use: Yes    Alcohol/week: 2.0 standard drinks of alcohol    Types: 2 Glasses of wine per week    Comment: 1-2 glasses wine a week   Drug use: Never   Sexual activity: Yes  Other Topics Concern   Not on file  Social History Narrative   Lives at home alone.   Right-handed.   1 cup caffeine per day.   Social Determinants of Health   Financial Resource Strain: Not on file  Food Insecurity: Not on file  Transportation Needs: Not on file  Physical Activity: Not on file  Stress: Not on file  Social Connections: Not on file  Intimate Partner Violence: Not on file      Levert Feinstein, M.D. Ph.D.  [facility] [address] [place], [place] [address] Phone: [phone] Fax:      [phone]

## 2022-10-08 DIAGNOSIS — E890 Postprocedural hypothyroidism: Secondary | ICD-10-CM | POA: Diagnosis not present

## 2022-10-08 DIAGNOSIS — R7301 Impaired fasting glucose: Secondary | ICD-10-CM | POA: Diagnosis not present

## 2022-10-08 LAB — C-REACTIVE PROTEIN: CRP: <1 mg/L (ref 0–10)

## 2022-10-08 LAB — RPR: RPR Ser Ql: NONREACTIVE

## 2022-10-08 LAB — ANA W/REFLEX IF POSITIVE: Anti Nuclear Antibody (ANA): NEGATIVE

## 2022-10-08 LAB — VITAMIN B12: Vitamin B-12: 586 pg/mL (ref 232–1245)

## 2022-10-08 LAB — HIV ANTIBODY (ROUTINE TESTING W REFLEX): HIV Screen 4th Generation wRfx: NONREACTIVE

## 2022-10-08 LAB — SEDIMENTATION RATE: Sed Rate: 2 mm/hr (ref 0–40)

## 2022-10-13 DIAGNOSIS — E890 Postprocedural hypothyroidism: Secondary | ICD-10-CM | POA: Diagnosis not present

## 2022-10-14 ENCOUNTER — Ambulatory Visit: Payer: BC Managed Care – PPO | Admitting: Neurology

## 2022-10-18 ENCOUNTER — Telehealth: Payer: Self-pay | Admitting: Neurology

## 2022-10-18 NOTE — Telephone Encounter (Signed)
Pt called. Requesting a call back to reschedule MRI.

## 2022-10-18 NOTE — Telephone Encounter (Signed)
I called her back and scheduled for 5/14 at 1pm at [facility]

## 2022-10-19 ENCOUNTER — Telehealth: Payer: Self-pay | Admitting: Neurology

## 2022-10-19 ENCOUNTER — Ambulatory Visit (INDEPENDENT_AMBULATORY_CARE_PROVIDER_SITE_OTHER): Payer: BC Managed Care – PPO

## 2022-10-19 DIAGNOSIS — E039 Hypothyroidism, unspecified: Secondary | ICD-10-CM | POA: Diagnosis not present

## 2022-10-19 DIAGNOSIS — G43709 Chronic migraine without aura, not intractable, without status migrainosus: Secondary | ICD-10-CM

## 2022-10-19 MED ORDER — GADOBENATE DIMEGLUMINE 529 MG/ML IV SOLN
10.0000 mL | Freq: Once | INTRAVENOUS | Status: AC | PRN
  Administered 2022-10-19: 10 mL via INTRAVENOUS

## 2022-10-19 NOTE — Telephone Encounter (Signed)
Patiens is having an MRI 5/14 she asked if you wanted MRI results from 12 years ago to compare. If you think she needs to get these please let her know and she will request. Thank you

## 2022-10-19 NOTE — Telephone Encounter (Signed)
I have MRI brain report from 2012,[facility]   1. There is no evidence of mass, hemorrhage or ischemic acute disease.  2. Minimal right maxillar sinus disease.   We will see what this MRI brain show,   no need to get previous record now,

## 2022-10-21 DIAGNOSIS — F332 Major depressive disorder, recurrent severe without psychotic features: Secondary | ICD-10-CM | POA: Diagnosis not present

## 2022-10-28 ENCOUNTER — Ambulatory Visit (INDEPENDENT_AMBULATORY_CARE_PROVIDER_SITE_OTHER): Payer: BC Managed Care – PPO | Admitting: Neurology

## 2022-10-28 DIAGNOSIS — R569 Unspecified convulsions: Secondary | ICD-10-CM

## 2022-10-28 DIAGNOSIS — E039 Hypothyroidism, unspecified: Secondary | ICD-10-CM

## 2022-10-28 DIAGNOSIS — G43709 Chronic migraine without aura, not intractable, without status migrainosus: Secondary | ICD-10-CM

## 2022-11-03 NOTE — Procedures (Signed)
    History:  52 years old woman with events concerning for seizures.   EEG classification:  Awake and asleep  Description of the recording: The background rhythms of this recording consists of a fairly well modulated medium amplitude background beta activity that is likely secondary to medication. As the record progresses, the patient initially is in the waking state, but appears to enter the early stage II sleep during the recording, with rudimentary sleep spindles and vertex sharp wave activity seen. During the wakeful state, photic stimulation is performed, and no abnormal responses were seen. Hyperventilation was also performed, no abnormal response seen. No epileptiform discharges seen during this recording. There was no focal slowing.   Abnormality: None   Impression: This is a normal EEG recording in the waking and sleeping state. The beta activity seen in this recording is likely secondary to medication side effects. No evidence of interictal epileptiform discharges. Normal EEGs, however, do not rule out epilepsy.    Windell Norfolk, MD [facility]

## 2022-11-04 DIAGNOSIS — F332 Major depressive disorder, recurrent severe without psychotic features: Secondary | ICD-10-CM | POA: Diagnosis not present

## 2022-11-09 DIAGNOSIS — M1990 Unspecified osteoarthritis, unspecified site: Secondary | ICD-10-CM | POA: Diagnosis not present

## 2022-11-10 ENCOUNTER — Encounter: Payer: Self-pay | Admitting: Neurology

## 2022-11-12 DIAGNOSIS — G248 Other dystonia: Secondary | ICD-10-CM | POA: Diagnosis not present

## 2022-11-15 ENCOUNTER — Other Ambulatory Visit (HOSPITAL_COMMUNITY): Payer: Self-pay

## 2022-11-15 ENCOUNTER — Telehealth: Payer: Self-pay

## 2022-11-15 NOTE — Telephone Encounter (Signed)
Pharmacy Patient Advocate Encounter   Received notification from [employer] that prior authorization for AJOVY (fremanezumab-vfrm) injection 225MG /1.5ML syringes is required/requested.   PA submitted to [facility] via CoverMyMeds Key or (Medicaid) confirmation # [id number] Status is pending

## 2022-11-16 ENCOUNTER — Encounter (HOSPITAL_COMMUNITY): Payer: Self-pay

## 2022-11-16 ENCOUNTER — Ambulatory Visit (HOSPITAL_COMMUNITY)
Admission: RE | Admit: 2022-11-16 | Discharge: 2022-11-16 | Disposition: A | Discharge status: Home / Self Care | Payer: BC Managed Care – PPO | Source: Ambulatory Visit | Attending: Emergency Medicine | Admitting: Emergency Medicine

## 2022-11-16 VITALS — BP 125/84 | HR 74 | Temp 98.0°F | Resp 18

## 2022-11-16 DIAGNOSIS — N3001 Acute cystitis with hematuria: Secondary | ICD-10-CM | POA: Insufficient documentation

## 2022-11-16 LAB — POCT URINALYSIS DIP (MANUAL ENTRY)
Bilirubin, UA: NEGATIVE
Glucose, UA: NEGATIVE mg/dL
Ketones, POC UA: NEGATIVE mg/dL
Leukocytes, UA: NEGATIVE
Nitrite, UA: POSITIVE — AB
Protein Ur, POC: NEGATIVE mg/dL
Spec Grav, UA: <=1.005 — AB (ref 1.010–1.025)
Urobilinogen, UA: 0.2 E.U./dL
pH, UA: 6 (ref 5.0–8.0)

## 2022-11-16 MED ORDER — CEPHALEXIN 500 MG PO CAPS: 500.0000 mg | ORAL_CAPSULE | Freq: Two times a day (BID) | ORAL | 0 refills | Status: AC

## 2022-11-16 MED ORDER — ONDANSETRON 4 MG PO TBDP: 4.0000 mg | ORAL_TABLET | Freq: Four times a day (QID) | ORAL | 0 refills | Status: DC | PRN

## 2022-11-16 MED ORDER — ONDANSETRON 4 MG PO TBDP
4.0000 mg | ORAL_TABLET | Freq: Once | ORAL | Status: AC
  Administered 2022-11-16: 4 mg via ORAL

## 2022-11-16 MED ORDER — ONDANSETRON 4 MG PO TBDP
ORAL_TABLET | ORAL | Status: AC
  Filled 2022-11-16: qty 1

## 2022-11-16 NOTE — Discharge Instructions (Addendum)
Please take antibiotic as prescribed. Take with food to avoid upset stomach. You can use the zofran about 30 minutes before taking the medicine to keep stomach settled, or every 6 hours as needed for your nausea. Continue drinking lots of fluids!!  We will call you if urine culture results and requires any change in treatment. This can take 2-3 days.  Please call your urologist to let them know what's going on and to make follow up appointment. Please go to the emergency department if symptoms worsen.

## 2022-11-16 NOTE — ED Triage Notes (Signed)
Pt states hx of chronic UTI's. States had bladder pressure on Sunday. States today having nausea and fatigue. Took AZO and drinking lots of water with no relief.

## 2022-11-16 NOTE — ED Provider Notes (Addendum)
[facility]    CSN: [id number] Arrival date & time: 11/16/22  1733     History   Chief Complaint Chief Complaint  Patient presents with   Urinary Retention    Have mild sx of starting some pain/pressure Sun night. Took AZO and felt better. Last night began feeling nauseous with no appetite as well as fatigue. Stopped AZ0.Nausea and fatigue worse sx IGu - Entered by patient    HPI Rose Austin is a 52 y.o. female.  2 days ago was having bladder pressure. She took Azo that helped temporarily. Now discomfort is 2/10. Last night developed nausea and fatigue which are now worse today. Denies vomiting. Has been increasing fluid intake which usually works No flank pain. Denies fever or chills  Reports history of chronic UTIs and she does have a urologist   Past Medical History:  Diagnosis Date   Anterior pituitary hyperfunction (HCC)    Anxiety    Benign neoplasm of skin    Depression    Focal dystonia    Gait disorder    Genetic torsion dystonia    Hypotension    Intractable chronic migraine without aura and without status migrainosus    Iodine hypothyroidism    Irregular menses    Other neutropenia (HCC)    Seborrheic dermatitis    Thrombosed external hemorrhoid     Patient Active Problem List   Diagnosis Date Noted   Depression 10/05/2022   Hypothyroid 10/05/2022   Anxiety 10/05/2022   Localized, primary osteoarthritis of hand 03/17/2021   Pain in right hand 03/17/2021   Recurrent UTI 03/02/2019   Low memory T cell count determined by flow cytometry 03/02/2019   Arthritis of finger of both hands 02/03/2019   Foreign body in hand 02/03/2019   Low immunoglobulin level 05/23/2018   Chronic migraine w/o aura w/o status migrainosus, not intractable 05/12/2018   Bursitis of elbow 05/12/2018   Pain in joint of left elbow 05/12/2018   Common variable immunodeficiency (HCC) 11/15/2017   Thrombosed external hemorrhoid 06/24/2017   Weight loss 05/17/2017    Intractable chronic migraine without aura and without status migrainosus 09/23/2016   Irregular menses 04/22/2014   Well female exam with routine gynecological exam 07/05/2013   Genetic torsion dystonia 04/01/2011   Gait disorder 03/18/2011   Pain in joint involving lower leg 11/24/2010   Benign neoplasm of skin 09/30/2010   Other neutropenia (HCC) 07/27/2010   Diarrhea 07/27/2010   Iodine hypothyroidism 05/05/2010   Seborrheic dermatitis 03/31/2010    Past Surgical History:  Procedure Laterality Date   BREAST BIOPSY Left 09/23/2020   HYMENECTOMY      OB History   No obstetric history on file.      Home Medications    Prior to Admission medications   Medication Sig Start Date End Date Taking? Authorizing Provider  cephALEXin (KEFLEX) 500 MG capsule Take 1 capsule (500 mg total) by mouth 2 (two) times daily for 7 days. 11/16/22 11/23/22 Yes Dorothyann Mourer, Lurena Joiner, PA-C  ondansetron (ZOFRAN-ODT) 4 MG disintegrating tablet Take 1 tablet (4 mg total) by mouth every 6 (six) hours as needed for nausea or vomiting. 11/16/22  Yes Maisen Schmit, Lurena Joiner, PA-C  buPROPion (WELLBUTRIN SR) 200 MG 12 hr tablet Take 200 mg by mouth 2 (two) times daily.    [provider]  cetirizine (ZYRTEC) 10 MG tablet Take 10 mg by mouth daily.    [provider]  Ciclopirox 1 % shampoo Apply 1 application topically at bedtime.  [provider]  clobetasol (TEMOVATE) 0.05 % external solution Apply 1 application topically 2 (two) times daily.    [provider]  clonazePAM (KLONOPIN) 0.5 MG tablet Take 0.25-0.75 mg by mouth See admin instructions. Take 0.75mg  qam, 0.25mg  at noon    [provider]  Coenzyme Q10 (COQ10) 100 MG CAPS Take 100 mg by mouth 2 (two) times daily.    [provider]  Esketamine HCl, 84 MG Dose, (SPRAVATO, 84 MG DOSE,) 28 MG/DEVICE SOPK Place into the nose.    [provider]  fluocinonide ointment (LIDEX) 0.05 % Apply 1 application  topically 2 (two) times daily as needed (Dermatitis). Hands    [provider]  Fremanezumab-vfrm (AJOVY) 225 MG/1.5ML SOSY Inject 225 mg into the skin every 30 (thirty) days. 05/26/22   Glean Salvo, NP  hydroxychloroquine (PLAQUENIL) 200 MG tablet Take 300 mg by mouth daily.    [provider]  ibuprofen (ADVIL) 200 MG tablet Take 200 mg by mouth as needed.    [provider]  L-Methylfolate (DEPLIN PO) Take 1 tablet by mouth at bedtime.     [provider]  levothyroxine (SYNTHROID) 100 MCG tablet Take 100 mcg by mouth daily before breakfast. Take 1 tab daily except 1.5 tablet on Monday and Wednesday    [provider]  Multiple Vitamin (MULTIVITAMIN) tablet Take 1 tablet by mouth at bedtime.     [provider]  Nerve Stimulator (CEFALY KIT) DEVI by Does not apply route daily.     [provider]  Probiotic Product (PROBIOTIC PO) Take 1 capsule by mouth daily.    [provider]  promethazine (PHENERGAN) 25 MG tablet Take 1 tablet (25 mg total) by mouth every 8 (eight) hours as needed for nausea or vomiting. 10/07/22   Levert Feinstein, MD  rizatriptan (MAXALT-MLT) 10 MG disintegrating tablet Take 1 tablet (10 mg total) by mouth as needed for migraine. May repeat in 2 hours if needed 05/26/22   Glean Salvo, NP  Sod Fluoride-Potassium Nitrate 1.1-5 % PSTE Place 1 application onto teeth at bedtime.     [provider]  topiramate (TOPAMAX) 50 MG tablet Take 1 tablet (50 mg total) by mouth 2 (two) times daily. 05/26/22   Glean Salvo, NP  tranylcypromine (PARNATE) 10 MG tablet Take 10 mg by mouth 3 (three) times daily.    [provider]  traZODone (DESYREL) 50 MG tablet Take 25-50 mg by mouth at bedtime.    [provider]  Ubrogepant (UBRELVY) 100 MG TABS Take 100 mg by mouth as needed (take 1 headache at onset of headache, may repeat in 2 hours if needed, max is 200 mg in 24 hours). 02/01/22   Glean Salvo, NP    Family History Family History  Problem Relation Age of Onset   Dementia Mother    Hypertension Mother    Colon cancer Maternal Grandmother    Lung cancer Paternal Grandfather    Dementia Maternal Grandfather    Hypertension Maternal Grandfather    Breast cancer Paternal Grandmother     Social History Social History   Tobacco Use   Smoking status: Never   Smokeless tobacco: Never  Vaping Use   Vaping Use: Never used  Substance Use Topics   Alcohol use: Yes    Alcohol/week: 2.0 standard drinks of alcohol    Types: 2 Glasses of wine per week    Comment: 1-2 glasses wine a week  Drug use: Never     Allergies   Patient has no known allergies.   Review of Systems Review of Systems As per HPI  Physical Exam Triage Vital Signs ED Triage Vitals  Enc Vitals Group     BP 11/16/22 1747 125/84     Pulse Rate 11/16/22 1747 74     Resp 11/16/22 1747 (!) 97     Temp 11/16/22 1747 98 F (36.7 C)     Temp Source 11/16/22 1747 Oral     SpO2 --      Weight --      Height --      Head Circumference --      Peak Flow --      Pain Score 11/16/22 1748 2     Pain Loc --      Pain Edu? --      Excl. in GC? --    No data found.  Updated Vital Signs BP 125/84 (BP Location: Right Arm)   Pulse 74   Temp 98 F (36.7 C) (Oral)   Resp 18   SpO2 97%    Physical Exam Vitals and nursing note reviewed.  Constitutional:      General: She is not in acute distress. HENT:     Mouth/Throat:     Mouth: Mucous membranes are moist.     Pharynx: Oropharynx is clear.  Eyes:     Conjunctiva/sclera: Conjunctivae normal.     Pupils: Pupils are equal, round, and reactive to light.  Cardiovascular:     Rate and Rhythm: Normal rate and regular rhythm.     Heart sounds: Normal heart sounds.  Pulmonary:     Effort: Pulmonary effort is normal.     Breath sounds: Normal breath sounds.  Abdominal:     General: Bowel sounds are normal.     Palpations: Abdomen is soft.      Tenderness: There is no abdominal tenderness. There is no right CVA tenderness or left CVA tenderness.  Neurological:     Mental Status: She is alert and oriented to person, place, and time.     UC Treatments / Results  Labs (all labs ordered are listed, but only abnormal results are displayed) Labs Reviewed  POCT URINALYSIS DIP (MANUAL ENTRY) - Abnormal; Notable for the following components:      Result Value   Spec Grav, UA <=1.005 (*)    Blood, UA trace-intact (*)    Nitrite, UA Positive (*)    All other components within normal limits  URINE CULTURE    EKG  Radiology No results found.  Procedures Procedures (including critical care time)  Medications Ordered in UC Medications  ondansetron (ZOFRAN-ODT) disintegrating tablet 4 mg (4 mg Oral Given 11/16/22 1805)    Initial Impression / Assessment and Plan / UC Course  I have reviewed the triage vital signs and the nursing notes.  Pertinent labs & imaging results that were available during my care of the patient were reviewed by me and considered in my medical decision making (see chart for details).   Zofran ODT given in clinic with improvement in nausea. She is afebrile and well appearing. No abd pain or CVA tenderness on exam.  UA with trace RBC, +nitrate. Unknown if related to Azo use. Will culture. Also low specific gravity. With history will cover for acute cystitis with keflex BID x 7 days. Sent zofran to use q6 hours at home for nausea and with abx. Advised to continue hydrating. Monitor  for worsening symptoms, ED precautions. Follow with urology  Final Clinical Impressions(s) / UC Diagnoses   Final diagnoses:  Acute cystitis with hematuria     Discharge Instructions      Please take antibiotic as prescribed. Take with food to avoid upset stomach. You can use the zofran about 30 minutes before taking the medicine to keep stomach settled, or every 6 hours as needed for your nausea. Continue drinking  lots of fluids!!  We will call you if urine culture results and requires any change in treatment. This can take 2-3 days.  Please call your urologist to let them know what's going on and to make follow up appointment. Please go to the emergency department if symptoms worsen.     ED Prescriptions     Medication Sig Dispense Auth. Provider   ondansetron (ZOFRAN-ODT) 4 MG disintegrating tablet Take 1 tablet (4 mg total) by mouth every 6 (six) hours as needed for nausea or vomiting. 20 tablet Allante Beane, PA-C   cephALEXin (KEFLEX) 500 MG capsule Take 1 capsule (500 mg total) by mouth 2 (two) times daily for 7 days. 14 capsule Bernell Haynie, Lurena Joiner, PA-C      PDMP not reviewed this encounter.     Altair Appenzeller, Ray Church 11/16/22 1848

## 2022-11-17 LAB — URINE CULTURE: Culture: <10000 — AB

## 2022-11-23 ENCOUNTER — Other Ambulatory Visit: Payer: Self-pay | Admitting: Neurology

## 2022-11-23 DIAGNOSIS — F332 Major depressive disorder, recurrent severe without psychotic features: Secondary | ICD-10-CM | POA: Diagnosis not present

## 2022-11-24 ENCOUNTER — Ambulatory Visit
Admission: RE | Admit: 2022-11-24 | Discharge: 2022-11-24 | Disposition: A | Discharge status: Home / Self Care | Payer: BC Managed Care – PPO | Source: Ambulatory Visit | Attending: Family Medicine | Admitting: Family Medicine

## 2022-11-24 DIAGNOSIS — N6313 Unspecified lump in the right breast, lower outer quadrant: Secondary | ICD-10-CM | POA: Diagnosis not present

## 2022-11-24 DIAGNOSIS — N6310 Unspecified lump in the right breast, unspecified quadrant: Secondary | ICD-10-CM

## 2022-11-25 DIAGNOSIS — E890 Postprocedural hypothyroidism: Secondary | ICD-10-CM | POA: Diagnosis not present

## 2022-11-30 NOTE — Telephone Encounter (Signed)
Pharmacy Patient Advocate Encounter  Prior Authorization for AJOVY (fremanezumab-vfrm) injection 225MG /1.5ML syringes has been APPROVED by [employer] from 11/25/2022 to 11/24/2023.   PA # PA Case ID #: [id number]

## 2022-12-01 DIAGNOSIS — D122 Benign neoplasm of ascending colon: Secondary | ICD-10-CM | POA: Diagnosis not present

## 2022-12-01 DIAGNOSIS — Z09 Encounter for follow-up examination after completed treatment for conditions other than malignant neoplasm: Secondary | ICD-10-CM | POA: Diagnosis not present

## 2022-12-01 DIAGNOSIS — K648 Other hemorrhoids: Secondary | ICD-10-CM | POA: Diagnosis not present

## 2022-12-01 DIAGNOSIS — Z86010 Personal history of colonic polyps: Secondary | ICD-10-CM | POA: Diagnosis not present

## 2022-12-01 DIAGNOSIS — D123 Benign neoplasm of transverse colon: Secondary | ICD-10-CM | POA: Diagnosis not present

## 2022-12-08 ENCOUNTER — Other Ambulatory Visit: Payer: Self-pay | Admitting: Family Medicine

## 2022-12-08 DIAGNOSIS — N6310 Unspecified lump in the right breast, unspecified quadrant: Secondary | ICD-10-CM

## 2022-12-13 DIAGNOSIS — L603 Nail dystrophy: Secondary | ICD-10-CM | POA: Diagnosis not present

## 2022-12-13 DIAGNOSIS — L821 Other seborrheic keratosis: Secondary | ICD-10-CM | POA: Diagnosis not present

## 2022-12-13 DIAGNOSIS — D2262 Melanocytic nevi of left upper limb, including shoulder: Secondary | ICD-10-CM | POA: Diagnosis not present

## 2022-12-13 DIAGNOSIS — D2261 Melanocytic nevi of right upper limb, including shoulder: Secondary | ICD-10-CM | POA: Diagnosis not present

## 2022-12-14 DIAGNOSIS — F332 Major depressive disorder, recurrent severe without psychotic features: Secondary | ICD-10-CM | POA: Diagnosis not present

## 2022-12-16 ENCOUNTER — Ambulatory Visit: Payer: BC Managed Care – PPO | Admitting: Neurology

## 2022-12-21 DIAGNOSIS — F332 Major depressive disorder, recurrent severe without psychotic features: Secondary | ICD-10-CM | POA: Diagnosis not present

## 2022-12-23 ENCOUNTER — Ambulatory Visit: Payer: BC Managed Care – PPO | Admitting: Neurology

## 2022-12-23 ENCOUNTER — Telehealth: Payer: Self-pay | Admitting: Neurology

## 2022-12-23 NOTE — Telephone Encounter (Signed)
Pt states just 2 nights ago she had another amnesia episode, prior to it she had a migraine, pts states the day after she had a migraine as well.  Pt is asking if as a result of this can she be seen by either Dr Terrace Arabia or Maralyn Sago, NP before her current scheduled appointment.  Pt informed this request will be sent to RN's for review , and for them to decide.

## 2022-12-23 NOTE — Telephone Encounter (Signed)
Called patient and she relays she had an episode like what happened for the visit to the ER visit in march. She was having a conversation with a friend at her house and she went to her room and doesn't remember anything after but says she woke up with different clothes on they were random clothes and not pajamas and when she woke up she was very sensitive to light and had a migraine before and after the episode. She feels better now, I advised the ER if someone witnesses these episodes and she said she has already done that once and it wasn't helpful. I advised I would send to providers for review.

## 2022-12-23 NOTE — Telephone Encounter (Signed)
Please advise patient to continue to document spells, keep [place] informed,

## 2022-12-24 NOTE — Telephone Encounter (Signed)
1st attempt: lmtcb

## 2022-12-27 NOTE — Telephone Encounter (Signed)
Lmtcb 2nd attempt

## 2022-12-27 NOTE — Telephone Encounter (Signed)
Took incoming call from patient who states she will document the episodes and bring to her next appointment

## 2022-12-29 DIAGNOSIS — F332 Major depressive disorder, recurrent severe without psychotic features: Secondary | ICD-10-CM | POA: Diagnosis not present

## 2023-01-22 ENCOUNTER — Encounter (HOSPITAL_COMMUNITY): Payer: Self-pay

## 2023-01-22 ENCOUNTER — Ambulatory Visit (HOSPITAL_COMMUNITY)
Admission: RE | Admit: 2023-01-22 | Discharge: 2023-01-22 | Disposition: A | Discharge status: Home / Self Care | Payer: BC Managed Care – PPO | Source: Ambulatory Visit

## 2023-01-22 VITALS — BP 98/63 | HR 71 | Temp 98.2°F | Resp 16

## 2023-01-22 DIAGNOSIS — N3001 Acute cystitis with hematuria: Secondary | ICD-10-CM | POA: Insufficient documentation

## 2023-01-22 LAB — POCT URINALYSIS DIP (MANUAL ENTRY)
Bilirubin, UA: NEGATIVE
Glucose, UA: NEGATIVE mg/dL
Ketones, POC UA: NEGATIVE mg/dL
Nitrite, UA: NEGATIVE
Protein Ur, POC: NEGATIVE mg/dL
Spec Grav, UA: 1.015 (ref 1.010–1.025)
Urobilinogen, UA: 0.2 E.U./dL
pH, UA: 5.5 (ref 5.0–8.0)

## 2023-01-22 MED ORDER — NITROFURANTOIN MONOHYD MACRO 100 MG PO CAPS: 100.0000 mg | ORAL_CAPSULE | Freq: Two times a day (BID) | ORAL | 0 refills | Status: AC

## 2023-01-22 NOTE — ED Triage Notes (Signed)
Reports feelings of lower abdominal pressure, hx of frequent UTI's presenting this way. At home test showed positive for WBC and nitrites.

## 2023-01-22 NOTE — Discharge Instructions (Signed)
Take Macrobid as prescribed to treat possible UTI.  We will contact you early next week if the urine culture shows we need to change or stop taking the antibiotic.  Continue plenty of hydration with water.  If you develop fever, nausea/vomiting and are unable to keep fluids down, go to the ER.

## 2023-01-22 NOTE — ED Provider Notes (Signed)
[facility]    CSN: [id number] Arrival date & time: 01/22/23  1707      History   Chief Complaint Chief Complaint  Patient presents with   Urinary Frequency    I thinkI have UTI - Entered by patient    HPI Rose Austin is a 52 y.o. female.   Patient presents today with 1 day history of lower abdomen tightness.  Reports she has history of frequent UTI, got some test strips from the pharmacy today and tested positive at home for white blood cells and nitrites.  She denies dysuria, urinary frequency or urgency, new urinary incontinence, foul urinary odor, abdominal pain, hematuria, flank pain, fever, nausea/vomiting, and vaginal discharge.  Has been increasing water intake and taking ibuprofen for symptoms with minimal improvement.  Reports she does have a urologist, last documented UTI was more than 3 month ago.  She was seen for possible UTI 11/2022, treated with Keflex but only took a couple of days because the culture came back negative and she stopped it.    Past Medical History:  Diagnosis Date   Anterior pituitary hyperfunction (HCC)    Anxiety    Benign neoplasm of skin    Depression    Focal dystonia    Gait disorder    Genetic torsion dystonia    Hypotension    Intractable chronic migraine without aura and without status migrainosus    Iodine hypothyroidism    Irregular menses    Other neutropenia (HCC)    Seborrheic dermatitis    Thrombosed external hemorrhoid     Patient Active Problem List   Diagnosis Date Noted   Depression 10/05/2022   Hypothyroid 10/05/2022   Anxiety 10/05/2022   Localized, primary osteoarthritis of hand 03/17/2021   Pain in right hand 03/17/2021   Recurrent UTI 03/02/2019   Low memory T cell count determined by flow cytometry 03/02/2019   Arthritis of finger of both hands 02/03/2019   Foreign body in hand 02/03/2019   Low immunoglobulin level 05/23/2018   Chronic migraine w/o aura w/o status migrainosus, not  intractable 05/12/2018   Bursitis of elbow 05/12/2018   Pain in joint of left elbow 05/12/2018   Common variable immunodeficiency (HCC) 11/15/2017   Thrombosed external hemorrhoid 06/24/2017   Weight loss 05/17/2017   Intractable chronic migraine without aura and without status migrainosus 09/23/2016   Irregular menses 04/22/2014   Well female exam with routine gynecological exam 07/05/2013   Genetic torsion dystonia 04/01/2011   Gait disorder 03/18/2011   Pain in joint involving lower leg 11/24/2010   Benign neoplasm of skin 09/30/2010   Other neutropenia (HCC) 07/27/2010   Diarrhea 07/27/2010   Iodine hypothyroidism 05/05/2010   Seborrheic dermatitis 03/31/2010    Past Surgical History:  Procedure Laterality Date   BREAST BIOPSY Left 09/23/2020   HYMENECTOMY      OB History   No obstetric history on file.      Home Medications    Prior to Admission medications   Medication Sig Start Date End Date Taking? Authorizing Provider  Ascorbic Acid (VITAMIN C) 100 MG tablet Take 100 mg by mouth daily.   Yes [provider]  buPROPion (WELLBUTRIN SR) 200 MG 12 hr tablet Take 200 mg by mouth 2 (two) times daily.   Yes [provider]  cetirizine (ZYRTEC) 10 MG tablet Take 10 mg by mouth daily.   Yes [provider]  clobetasol (TEMOVATE) 0.05 % external solution Apply 1 application topically 2 (two)  times daily.   Yes [provider]  clonazePAM (KLONOPIN) 0.5 MG tablet Take 0.25-0.75 mg by mouth See admin instructions. Take 0.75mg  qam, 0.25mg  at noon   Yes [provider]  Coenzyme Q10 (COQ10) 100 MG CAPS Take 100 mg by mouth 2 (two) times daily.   Yes [provider]  conjugated estrogens (PREMARIN) vaginal cream Place 1 applicator vaginally daily.   Yes [provider]  Esketamine HCl, 84 MG Dose, (SPRAVATO, 84 MG DOSE,) 28 MG/DEVICE SOPK Place into the nose.   Yes [provider]  fluocinonide ointment  (LIDEX) 0.05 % Apply 1 application topically 2 (two) times daily as needed (Dermatitis). Hands   Yes [provider]  Fremanezumab-vfrm (AJOVY) 225 MG/1.5ML SOSY Inject 225 mg into the skin every 30 (thirty) days. 05/26/22  Yes Glean Salvo, NP  hydroxychloroquine (PLAQUENIL) 200 MG tablet Take 300 mg by mouth daily.   Yes [provider]  L-Methylfolate (DEPLIN PO) Take 1 tablet by mouth at bedtime.    Yes [provider]  levothyroxine (SYNTHROID) 100 MCG tablet Take 100 mcg by mouth daily before breakfast. Take 1 tab daily except 1.5 tablet on Monday and Wednesday   Yes [provider]  Multiple Vitamin (MULTIVITAMIN) tablet Take 1 tablet by mouth at bedtime.    Yes [provider]  promethazine (PHENERGAN) 25 MG tablet Take 1 tablet (25 mg total) by mouth every 8 (eight) hours as needed for nausea or vomiting. 10/07/22  Yes Levert Feinstein, MD  rizatriptan (MAXALT-MLT) 10 MG disintegrating tablet Take 1 tablet (10 mg total) by mouth as needed for migraine. May repeat in 2 hours if needed 05/26/22  Yes Glean Salvo, NP  topiramate (TOPAMAX) 50 MG tablet Take 1 tablet (50 mg total) by mouth 2 (two) times daily. 05/26/22  Yes Glean Salvo, NP  tranylcypromine (PARNATE) 10 MG tablet Take 10 mg by mouth 3 (three) times daily.   Yes [provider]  traZODone (DESYREL) 50 MG tablet Take 25-50 mg by mouth at bedtime.   Yes [provider]  UBRELVY 100 MG TABS TAKE 100 MG BY MOUTH AS NEEDED (TAKE 1 HEADACHE AT ONSET OF HEADACHE, MAY REPEAT IN 2 HOURS IF NEEDED, MAX IS 200 MG IN 24 HOURS). 11/23/22  Yes Glean Salvo, NP  Ciclopirox 1 % shampoo Apply 1 application topically at bedtime.     [provider]  ibuprofen (ADVIL) 200 MG tablet Take 200 mg by mouth as needed.    [provider]  Nerve Stimulator (CEFALY KIT) DEVI by Does not apply route daily.     [provider]  nitrofurantoin, macrocrystal-monohydrate,  (MACROBID) 100 MG capsule Take 1 capsule (100 mg total) by mouth 2 (two) times daily for 5 days. 01/22/23 01/27/23 Yes Valentino Nose, NP  ondansetron (ZOFRAN-ODT) 4 MG disintegrating tablet Take 1 tablet (4 mg total) by mouth every 6 (six) hours as needed for nausea or vomiting. 11/16/22   Rising, Lurena Joiner, PA-C  Probiotic Product (PROBIOTIC PO) Take 1 capsule by mouth daily.    [provider]  Sod Fluoride-Potassium Nitrate 1.1-5 % PSTE Place 1 application onto teeth at bedtime.     [provider]    Family History Family History  Problem Relation Age of Onset   Dementia Mother    Hypertension Mother    Colon cancer Maternal Grandmother    Lung cancer Paternal Grandfather    Dementia Maternal Grandfather    Hypertension Maternal  Grandfather    Breast cancer Paternal Grandmother     Social History Social History   Tobacco Use   Smoking status: Never   Smokeless tobacco: Never  Vaping Use   Vaping status: Never Used  Substance Use Topics   Alcohol use: Yes    Alcohol/week: 2.0 standard drinks of alcohol    Types: 2 Glasses of wine per week    Comment: 1-2 glasses wine a week   Drug use: Never     Allergies   Patient has no known allergies.   Review of Systems Review of Systems Per HPI  Physical Exam Triage Vital Signs ED Triage Vitals  Encounter Vitals Group     BP 01/22/23 1722 98/63     Systolic BP Percentile --      Diastolic BP Percentile --      Pulse Rate 01/22/23 1722 71     Resp 01/22/23 1722 16     Temp 01/22/23 1722 98.2 F (36.8 C)     Temp Source 01/22/23 1722 Oral     SpO2 01/22/23 1722 97 %     Weight --      Height --      Head Circumference --      Peak Flow --      Pain Score 01/22/23 1723 4     Pain Loc --      Pain Education --      Exclude from Growth Chart --    No data found.  Updated Vital Signs BP 98/63 (BP Location: Right Arm)   Pulse 71   Temp 98.2 F (36.8 C) (Oral)   Resp 16   SpO2 97%   Visual  Acuity Right Eye Distance:   Left Eye Distance:   Bilateral Distance:    Right Eye Near:   Left Eye Near:    Bilateral Near:     Physical Exam Vitals and nursing note reviewed.  Constitutional:      General: She is not in acute distress.    Appearance: She is not toxic-appearing.  Pulmonary:     Effort: Pulmonary effort is normal. No respiratory distress.  Abdominal:     General: Abdomen is flat. Bowel sounds are normal. There is no distension.     Palpations: Abdomen is soft. There is no mass.     Tenderness: There is no abdominal tenderness. There is no right CVA tenderness, left CVA tenderness or guarding.  Skin:    General: Skin is warm and dry.     Coloration: Skin is not jaundiced or pale.     Findings: No erythema.  Neurological:     Mental Status: She is alert and oriented to person, place, and time.     Motor: No weakness.     Gait: Gait normal.  Psychiatric:        Behavior: Behavior is cooperative.      UC Treatments / Results  Labs (all labs ordered are listed, but only abnormal results are displayed) Labs Reviewed  POCT URINALYSIS DIP (MANUAL ENTRY) - Abnormal; Notable for the following components:      Result Value   Clarity, UA cloudy (*)    Blood, UA trace-intact (*)    Leukocytes, UA Moderate (2+) (*)    All other components within normal limits  URINE CULTURE    EKG   Radiology No results found.  Procedures Procedures (including critical care time)  Medications Ordered in UC Medications - No data to display  Initial  Impression / Assessment and Plan / UC Course  I have reviewed the triage vital signs and the nursing notes.  Pertinent labs & imaging results that were available during my care of the patient were reviewed by me and considered in my medical decision making (see chart for details).   Patient is well-appearing, normotensive, afebrile, not tachycardic, not tachypneic, oxygenating well on room air.    1. Acute cystitis with  hematuria Urinalysis cloudy with trace intact blood, moderate leukocyte esterase Urine culture pending Vitals and exam today reassuring Treat with Macrobid twice daily for 5 days Strict ER return precautions discussed with patient  The patient was given the opportunity to ask questions.  All questions answered to their satisfaction.  The patient is in agreement to this plan.    Final Clinical Impressions(s) / UC Diagnoses   Final diagnoses:  Acute cystitis with hematuria     Discharge Instructions      Take Macrobid as prescribed to treat possible UTI.  We will contact you early next week if the urine culture shows we need to change or stop taking the antibiotic.  Continue plenty of hydration with water.  If you develop fever, nausea/vomiting and are unable to keep fluids down, go to the ER.     ED Prescriptions     Medication Sig Dispense Auth. Provider   nitrofurantoin, macrocrystal-monohydrate, (MACROBID) 100 MG capsule Take 1 capsule (100 mg total) by mouth 2 (two) times daily for 5 days. 10 capsule Valentino Nose, NP      PDMP not reviewed this encounter.   Valentino Nose, NP 01/22/23 1756

## 2023-01-24 LAB — URINE CULTURE: Culture: 60000 — AB

## 2023-02-04 DIAGNOSIS — G248 Other dystonia: Secondary | ICD-10-CM | POA: Diagnosis not present

## 2023-02-05 ENCOUNTER — Ambulatory Visit (HOSPITAL_COMMUNITY)
Admission: RE | Admit: 2023-02-05 | Discharge: 2023-02-05 | Disposition: A | Discharge status: Home / Self Care | Payer: BC Managed Care – PPO | Source: Ambulatory Visit | Attending: Urgent Care | Admitting: Urgent Care

## 2023-02-05 ENCOUNTER — Encounter (HOSPITAL_COMMUNITY): Payer: Self-pay

## 2023-02-05 ENCOUNTER — Other Ambulatory Visit: Payer: Self-pay | Admitting: Neurology

## 2023-02-05 VITALS — BP 102/67 | HR 85 | Temp 98.1°F | Ht 66.0 in | Wt 125.0 lb

## 2023-02-05 DIAGNOSIS — R5383 Other fatigue: Secondary | ICD-10-CM | POA: Diagnosis not present

## 2023-02-05 DIAGNOSIS — R110 Nausea: Secondary | ICD-10-CM | POA: Insufficient documentation

## 2023-02-05 DIAGNOSIS — Z1152 Encounter for screening for COVID-19: Secondary | ICD-10-CM | POA: Insufficient documentation

## 2023-02-05 DIAGNOSIS — E039 Hypothyroidism, unspecified: Secondary | ICD-10-CM | POA: Insufficient documentation

## 2023-02-05 LAB — CBC WITH DIFFERENTIAL/PLATELET
Abs Immature Granulocytes: 0.01 10*3/uL (ref 0.00–0.07)
Basophils Absolute: 0 10*3/uL (ref 0.0–0.1)
Basophils Relative: 1 %
Eosinophils Absolute: 0.1 10*3/uL (ref 0.0–0.5)
Eosinophils Relative: 2 %
HCT: 34.7 % — ABNORMAL LOW (ref 36.0–46.0)
Hemoglobin: 11.5 g/dL — ABNORMAL LOW (ref 12.0–15.0)
Immature Granulocytes: 0 %
Lymphocytes Relative: 16 %
Lymphs Abs: 0.6 10*3/uL — ABNORMAL LOW (ref 0.7–4.0)
MCH: 29.6 pg (ref 26.0–34.0)
MCHC: 33.1 g/dL (ref 30.0–36.0)
MCV: 89.4 fL (ref 80.0–100.0)
Monocytes Absolute: 0.4 10*3/uL (ref 0.1–1.0)
Monocytes Relative: 9 %
Neutro Abs: 3 10*3/uL (ref 1.7–7.7)
Neutrophils Relative %: 72 %
Platelets: 205 10*3/uL (ref 150–400)
RBC: 3.88 MIL/uL (ref 3.87–5.11)
RDW: 11.9 % (ref 11.5–15.5)
WBC: 4.1 10*3/uL (ref 4.0–10.5)
nRBC: 0 % (ref 0.0–0.2)

## 2023-02-05 LAB — BASIC METABOLIC PANEL
Anion gap: 11 (ref 5–15)
BUN: 9 mg/dL (ref 6–20)
CO2: 25 mmol/L (ref 22–32)
Calcium: 9.2 mg/dL (ref 8.9–10.3)
Chloride: 101 mmol/L (ref 98–111)
Creatinine, Ser: 0.8 mg/dL (ref 0.44–1.00)
GFR, Estimated: >60 mL/min (ref >60)
Glucose, Bld: 84 mg/dL (ref 70–99)
Potassium: 3.6 mmol/L (ref 3.5–5.1)
Sodium: 137 mmol/L (ref 135–145)

## 2023-02-05 LAB — POCT URINALYSIS DIP (MANUAL ENTRY)
Bilirubin, UA: NEGATIVE
Blood, UA: NEGATIVE
Glucose, UA: NEGATIVE mg/dL
Ketones, POC UA: NEGATIVE mg/dL
Leukocytes, UA: NEGATIVE
Nitrite, UA: NEGATIVE
Protein Ur, POC: NEGATIVE mg/dL
Spec Grav, UA: 1.015 (ref 1.010–1.025)
Urobilinogen, UA: 0.2 E.U./dL
pH, UA: 6 (ref 5.0–8.0)

## 2023-02-05 LAB — TSH: TSH: 0.04 u[IU]/mL — ABNORMAL LOW (ref 0.350–4.500)

## 2023-02-05 LAB — T4, FREE: Free T4: 1.25 ng/dL — ABNORMAL HIGH (ref 0.61–1.12)

## 2023-02-05 LAB — SEDIMENTATION RATE: Sed Rate: 5 mm/hr (ref 0–22)

## 2023-02-05 MED ORDER — ONDANSETRON 4 MG PO TBDP: 4.0000 mg | ORAL_TABLET | Freq: Three times a day (TID) | ORAL | 0 refills | Status: DC | PRN

## 2023-02-05 NOTE — Discharge Instructions (Addendum)
Your urinalysis does not show any evidence of infection today. We obtained a COVID test due to your fatigue and nausea, and will call with the results once available. We also drew additional labs to assess for the cause of your symptoms. Please rest, increase your water intake.  You should be drinking 6-8 16 ounce bottles of water daily. Please call your PCP to schedule follow up should your symptoms persist or worsen.

## 2023-02-05 NOTE — ED Provider Notes (Signed)
[facility]    CSN: [id number] Arrival date & time: 02/05/23  1310      History   Chief Complaint Chief Complaint  Patient presents with   Urinary Frequency   Fatigue    HPI [place] Olesen is a 52 y.o. female.   52yo female presents today due to concerns of nausea and fatigue over the past 24 hours. She was seen here two weeks ago with a UTI; pt states these are sometimes symptoms that occur during UTIs for her. She took two home UTI tests, both of which were positive for leukocytes. She denies any dysuria, frequency or hesitancy. She did take all of the macrobid called in for her last visit. She reports mild nausea, but no abdominal pain, vomiting or diarrhea. She also reports extreme fatigue. Did have some chills, but no fever. Denies body aches. No rash, sore throat, headache.  She did take a home COVID test yesterday which was negative.  Patient does have a history of thyroid disorders, states it was checked "a while back", and states she has had numerous dosage changes recently due to it being "all over the place".  Patient also reports an autoimmune condition, she states she believes it is lupus, and follows with rheumatology for this.      Past Medical History:  Diagnosis Date   Anterior pituitary hyperfunction (HCC)    Anxiety    Benign neoplasm of skin    Depression    Focal dystonia    Gait disorder    Genetic torsion dystonia    Hypotension    Intractable chronic migraine without aura and without status migrainosus    Iodine hypothyroidism    Irregular menses    Other neutropenia (HCC)    Seborrheic dermatitis    Thrombosed external hemorrhoid     Patient Active Problem List   Diagnosis Date Noted   Depression 10/05/2022   Hypothyroid 10/05/2022   Anxiety 10/05/2022   Localized, primary osteoarthritis of hand 03/17/2021   Pain in right hand 03/17/2021   Recurrent UTI 03/02/2019   Low memory T cell count determined by flow cytometry 03/02/2019    Arthritis of finger of both hands 02/03/2019   Foreign body in hand 02/03/2019   Low immunoglobulin level 05/23/2018   Chronic migraine w/o aura w/o status migrainosus, not intractable 05/12/2018   Bursitis of elbow 05/12/2018   Pain in joint of left elbow 05/12/2018   Common variable immunodeficiency (HCC) 11/15/2017   Thrombosed external hemorrhoid 06/24/2017   Weight loss 05/17/2017   Intractable chronic migraine without aura and without status migrainosus 09/23/2016   Irregular menses 04/22/2014   Well female exam with routine gynecological exam 07/05/2013   Genetic torsion dystonia 04/01/2011   Gait disorder 03/18/2011   Pain in joint involving lower leg 11/24/2010   Benign neoplasm of skin 09/30/2010   Other neutropenia (HCC) 07/27/2010   Diarrhea 07/27/2010   Iodine hypothyroidism 05/05/2010   Seborrheic dermatitis 03/31/2010    Past Surgical History:  Procedure Laterality Date   BREAST BIOPSY Left 09/23/2020   HYMENECTOMY      OB History   No obstetric history on file.      Home Medications    Prior to Admission medications   Medication Sig Start Date End Date Taking? Authorizing Provider  Ascorbic Acid (VITAMIN C) 100 MG tablet Take 100 mg by mouth daily.   Yes [provider]  buPROPion (WELLBUTRIN SR) 200 MG 12 hr tablet Take 200 mg by mouth  2 (two) times daily.   Yes [provider]  cetirizine (ZYRTEC) 10 MG tablet Take 10 mg by mouth daily.   Yes [provider]  Ciclopirox 1 % shampoo Apply 1 application topically at bedtime.    Yes [provider]  clobetasol (TEMOVATE) 0.05 % external solution Apply 1 application topically 2 (two) times daily.   Yes [provider]  clonazePAM (KLONOPIN) 0.5 MG tablet Take 0.25-0.75 mg by mouth See admin instructions. Take 0.75mg  qam, 0.25mg  at noon   Yes [provider]  Coenzyme Q10 (COQ10) 100 MG CAPS Take 100 mg by mouth 2 (two) times daily.   Yes [provider]  conjugated estrogens (PREMARIN) vaginal cream Place 1 applicator vaginally daily.   Yes [provider]  Esketamine HCl, 84 MG Dose, (SPRAVATO, 84 MG DOSE,) 28 MG/DEVICE SOPK Place into the nose.   Yes [provider]  fluocinonide ointment (LIDEX) 0.05 % Apply 1 application topically 2 (two) times daily as needed (Dermatitis). Hands   Yes [provider]  Fremanezumab-vfrm (AJOVY) 225 MG/1.5ML SOSY Inject 225 mg into the skin every 30 (thirty) days. 05/26/22  Yes Glean Salvo, NP  hydroxychloroquine (PLAQUENIL) 200 MG tablet Take 300 mg by mouth daily.   Yes [provider]  ibuprofen (ADVIL) 200 MG tablet Take 200 mg by mouth as needed.   Yes [provider]  L-Methylfolate (DEPLIN PO) Take 1 tablet by mouth at bedtime.    Yes [provider]  levothyroxine (SYNTHROID) 100 MCG tablet Take 100 mcg by mouth daily before breakfast. Take 1 tab daily except 1.5 tablet on Monday and Wednesday   Yes [provider]  Multiple Vitamin (MULTIVITAMIN) tablet Take 1 tablet by mouth at bedtime.    Yes [provider]  Nerve Stimulator (CEFALY KIT) DEVI by Does not apply route daily.    Yes [provider]  Probiotic Product (PROBIOTIC PO) Take 1 capsule by mouth daily.   Yes [provider]  promethazine (PHENERGAN) 25 MG tablet Take 1 tablet (25 mg total) by mouth every 8 (eight) hours as needed for nausea or vomiting. 10/07/22  Yes Levert Feinstein, MD  rizatriptan (MAXALT-MLT) 10 MG disintegrating tablet Take 1 tablet (10 mg total) by mouth as needed for migraine. May repeat in 2 hours if needed 05/26/22  Yes Glean Salvo, NP  Sod Fluoride-Potassium Nitrate 1.1-5 % PSTE Place 1 application onto teeth at bedtime.    Yes [provider]  topiramate (TOPAMAX) 50 MG tablet Take 1 tablet (50 mg total) by mouth 2 (two) times daily. 05/26/22  Yes Glean Salvo, NP  tranylcypromine (PARNATE) 10 MG tablet  Take 10 mg by mouth 3 (three) times daily.   Yes [provider]  traZODone (DESYREL) 50 MG tablet Take 25-50 mg by mouth at bedtime.   Yes [provider]  UBRELVY 100 MG TABS TAKE 100 MG BY MOUTH AS NEEDED (TAKE 1 HEADACHE AT ONSET OF HEADACHE, MAY REPEAT IN 2 HOURS IF NEEDED, MAX IS 200 MG IN 24 HOURS). 11/23/22  Yes Glean Salvo, NP  ondansetron (ZOFRAN-ODT) 4 MG disintegrating tablet Take 1 tablet (4 mg total) by mouth every 8 (eight) hours as needed for nausea or vomiting. 02/05/23   Maretta Bees, PA    Family History Family History  Problem Relation Age of Onset   Dementia Mother    Hypertension Mother    Colon cancer Maternal Grandmother    Lung cancer  Paternal Grandfather    Dementia Maternal Grandfather    Hypertension Maternal Grandfather    Breast cancer Paternal Grandmother     Social History Social History   Tobacco Use   Smoking status: Never   Smokeless tobacco: Never  Vaping Use   Vaping status: Never Used  Substance Use Topics   Alcohol use: Yes    Alcohol/week: 2.0 standard drinks of alcohol    Types: 2 Glasses of wine per week    Comment: 1-2 glasses wine a week   Drug use: Never     Allergies   Patient has no known allergies.   Review of Systems Review of Systems As per HPI  Physical Exam Triage Vital Signs ED Triage Vitals  Encounter Vitals Group     BP 02/05/23 1328 102/67     Systolic BP Percentile --      Diastolic BP Percentile --      Pulse Rate 02/05/23 1328 85     Resp --      Temp 02/05/23 1328 98.1 F (36.7 C)     Temp Source 02/05/23 1328 Oral     SpO2 02/05/23 1328 97 %     Weight 02/05/23 1326 125 lb (56.7 kg)     Height 02/05/23 1326 5\' 6"  (1.676 m)     Head Circumference --      Peak Flow --      Pain Score 02/05/23 1325 0     Pain Loc --      Pain Education --      Exclude from Growth Chart --    No data found.  Updated Vital Signs BP 102/67 (BP Location: Left Arm)   Pulse 85   Temp 98.1  F (36.7 C) (Oral)   Ht 5\' 6"  (1.676 m)   Wt 125 lb (56.7 kg)   SpO2 97%   BMI 20.18 kg/m   Visual Acuity Right Eye Distance:   Left Eye Distance:   Bilateral Distance:    Right Eye Near:   Left Eye Near:    Bilateral Near:     Physical Exam Vitals and nursing note reviewed.  Constitutional:      General: She is not in acute distress.    Appearance: Normal appearance. She is well-developed and normal weight. She is not ill-appearing, toxic-appearing or diaphoretic.  HENT:     Head: Normocephalic.     Comments: Dermatitis noted to scalp, primarily midline    Right Ear: Tympanic membrane, ear canal and external ear normal. There is no impacted cerumen.     Left Ear: Tympanic membrane, ear canal and external ear normal. There is no impacted cerumen.     Nose: Nose normal. No congestion or rhinorrhea.     Mouth/Throat:     Mouth: Mucous membranes are moist.     Pharynx: Oropharynx is clear. No oropharyngeal exudate or posterior oropharyngeal erythema.  Eyes:     General: No scleral icterus.       Right eye: No discharge.        Left eye: No discharge.     Extraocular Movements: Extraocular movements intact.     Conjunctiva/sclera: Conjunctivae normal.     Pupils: Pupils are equal, round, and reactive to light.  Cardiovascular:     Rate and Rhythm: Normal rate and regular rhythm.     Heart sounds: No murmur heard. Pulmonary:     Effort: Pulmonary effort is normal. No respiratory distress.     Breath sounds: Normal  breath sounds.  Abdominal:     General: Abdomen is flat. There is no distension.     Palpations: Abdomen is soft.     Tenderness: There is no abdominal tenderness. There is no right CVA tenderness, left CVA tenderness or guarding.  Musculoskeletal:        General: No swelling.     Cervical back: Normal range of motion and neck supple. No rigidity or tenderness.  Skin:    General: Skin is warm and dry.     Capillary Refill: Capillary refill takes less than 2  seconds.     Coloration: Skin is not jaundiced.     Findings: No bruising, erythema or rash.  Neurological:     Mental Status: She is alert and oriented to person, place, and time.     Cranial Nerves: No cranial nerve deficit.  Psychiatric:        Mood and Affect: Mood normal.      UC Treatments / Results  Labs (all labs ordered are listed, but only abnormal results are displayed) Labs Reviewed  SARS CORONAVIRUS 2 (TAT 6-24 HRS)  BASIC METABOLIC PANEL  TSH  T4, FREE  CBC WITH DIFFERENTIAL/PLATELET  SEDIMENTATION RATE  POCT URINALYSIS DIP (MANUAL ENTRY)    EKG   Radiology No results found.  Procedures Procedures (including critical care time)  Medications Ordered in UC Medications - No data to display  Initial Impression / Assessment and Plan / UC Course  I have reviewed the triage vital signs and the nursing notes.  Pertinent labs & imaging results that were available during my care of the patient were reviewed by me and considered in my medical decision making (see chart for details).     Fatigue - sx non-specific. Given recent visit to nursing home coupled with nausea, will obtain covid test. Pt did have abnormal electrolytes and neutropenia noted on last labs in March, therefore will recheck today. VSS. Encouraged increased water intake. Nausea - no V/D. Normal abdominal exam in office. Pt has had recurrent nausea in past, requesting something other than phenergan to be called in. Hypothyroid - dose recently changed. Pt has not had TSH rechecked in "a while." Will recheck today to assess if this is the cause of #1.    Final Clinical Impressions(s) / UC Diagnoses   Final diagnoses:  Other fatigue  Nausea without vomiting  Hypothyroidism, unspecified type     Discharge Instructions      Your urinalysis does not show any evidence of infection today. We obtained a COVID test due to your fatigue and nausea, and will call with the results once available. We  also drew additional labs to assess for the cause of your symptoms. Please rest, increase your water intake.  You should be drinking 6-8 16 ounce bottles of water daily. Please call your PCP to schedule follow up should your symptoms persist or worsen.     ED Prescriptions     Medication Sig Dispense Auth. Provider   ondansetron (ZOFRAN-ODT) 4 MG disintegrating tablet Take 1 tablet (4 mg total) by mouth every 8 (eight) hours as needed for nausea or vomiting. 20 tablet Emmanuelle Coxe L, [place]      PDMP not reviewed this encounter.   Maretta Bees, [place] 02/05/23 1506

## 2023-02-05 NOTE — ED Triage Notes (Signed)
Pt states that she was here 2 weeks ago for a UTI and was given an antibiotic. Pt states that yesterday she began feeling extreme fatigue and nausea and is worried that her UTI has returned.  Pt denies any burning or odor.

## 2023-02-06 LAB — SARS CORONAVIRUS 2 (TAT 6-24 HRS): SARS Coronavirus 2: NEGATIVE

## 2023-02-18 DIAGNOSIS — E890 Postprocedural hypothyroidism: Secondary | ICD-10-CM | POA: Diagnosis not present

## 2023-02-23 DIAGNOSIS — F332 Major depressive disorder, recurrent severe without psychotic features: Secondary | ICD-10-CM | POA: Diagnosis not present

## 2023-02-24 DIAGNOSIS — Z23 Encounter for immunization: Secondary | ICD-10-CM | POA: Diagnosis not present

## 2023-02-24 DIAGNOSIS — E890 Postprocedural hypothyroidism: Secondary | ICD-10-CM | POA: Diagnosis not present

## 2023-03-09 DIAGNOSIS — F332 Major depressive disorder, recurrent severe without psychotic features: Secondary | ICD-10-CM | POA: Diagnosis not present

## 2023-03-16 DIAGNOSIS — F332 Major depressive disorder, recurrent severe without psychotic features: Secondary | ICD-10-CM | POA: Diagnosis not present

## 2023-03-17 DIAGNOSIS — R768 Other specified abnormal immunological findings in serum: Secondary | ICD-10-CM | POA: Diagnosis not present

## 2023-03-17 DIAGNOSIS — M79643 Pain in unspecified hand: Secondary | ICD-10-CM | POA: Diagnosis not present

## 2023-03-17 DIAGNOSIS — Z79899 Other long term (current) drug therapy: Secondary | ICD-10-CM | POA: Diagnosis not present

## 2023-03-17 DIAGNOSIS — M79641 Pain in right hand: Secondary | ICD-10-CM | POA: Diagnosis not present

## 2023-03-17 DIAGNOSIS — M1990 Unspecified osteoarthritis, unspecified site: Secondary | ICD-10-CM | POA: Diagnosis not present

## 2023-03-17 DIAGNOSIS — M19049 Primary osteoarthritis, unspecified hand: Secondary | ICD-10-CM | POA: Diagnosis not present

## 2023-03-17 DIAGNOSIS — M79642 Pain in left hand: Secondary | ICD-10-CM | POA: Diagnosis not present

## 2023-03-17 DIAGNOSIS — D72819 Decreased white blood cell count, unspecified: Secondary | ICD-10-CM | POA: Diagnosis not present

## 2023-03-23 DIAGNOSIS — F332 Major depressive disorder, recurrent severe without psychotic features: Secondary | ICD-10-CM | POA: Diagnosis not present

## 2023-03-30 DIAGNOSIS — F332 Major depressive disorder, recurrent severe without psychotic features: Secondary | ICD-10-CM | POA: Diagnosis not present

## 2023-04-06 DIAGNOSIS — F331 Major depressive disorder, recurrent, moderate: Secondary | ICD-10-CM | POA: Diagnosis not present

## 2023-04-11 ENCOUNTER — Telehealth: Payer: Self-pay | Admitting: Neurology

## 2023-04-11 ENCOUNTER — Ambulatory Visit (INDEPENDENT_AMBULATORY_CARE_PROVIDER_SITE_OTHER): Payer: BC Managed Care – PPO | Admitting: Neurology

## 2023-04-11 ENCOUNTER — Encounter: Payer: Self-pay | Admitting: Neurology

## 2023-04-11 ENCOUNTER — Other Ambulatory Visit: Payer: Self-pay | Admitting: Neurology

## 2023-04-11 VITALS — BP 129/74 | HR 70 | Ht 66.0 in | Wt 140.0 lb

## 2023-04-11 DIAGNOSIS — G43709 Chronic migraine without aura, not intractable, without status migrainosus: Secondary | ICD-10-CM

## 2023-04-11 DIAGNOSIS — R410 Disorientation, unspecified: Secondary | ICD-10-CM | POA: Insufficient documentation

## 2023-04-11 MED ORDER — TOPIRAMATE 100 MG PO TABS: 100.0000 mg | ORAL_TABLET | Freq: Two times a day (BID) | ORAL | 3 refills | Status: DC

## 2023-04-11 MED ORDER — RIZATRIPTAN BENZOATE 10 MG PO TBDP: 10.0000 mg | ORAL_TABLET | ORAL | 11 refills | Status: DC | PRN

## 2023-04-11 NOTE — Telephone Encounter (Signed)
I ordered 3 days ambulatory EEG

## 2023-04-11 NOTE — Telephone Encounter (Signed)
Thanks, will follow on the studies

## 2023-04-11 NOTE — Telephone Encounter (Signed)
Faxed aon form

## 2023-04-11 NOTE — Progress Notes (Signed)
ASSESSMENT AND PLAN 52 y.o. year old female   Chronic migraine headache Ajovy for migraine preventative,  Higher dose of Topamax 100 mg twice a day Maxalt 10 mg, phenergan prn Ubrevyl Previously tried and failed: Excedrin Migraine, Zomig, Imitrex, Maxalt  Advise her not use rescue medicine more than 3 times a week to avoid medicine rebound of headaches.   Worsening memory loss, transient confusion episode, in the setting of worsening mood disorder  MRI of the brain was normal in May 2024  EEG was normal.   Ordered 72 hours ambulatory EEG.   Hyperthyroidism  Low TSH, elevated free T4, thyrodism dose was adjusted.     HISTORY: Rose Austin is a 52 year old female, seen in request by her primary care PA Jarrett Soho, for evaluation of migraine headaches, initial evaluation was on May 12, 2018.   I have reviewed and summarized the referring note from the referring physician.  She had a past medical history of hypothyroidism, on supplement.   She had a history of migraine headaches since her 77s, trigger for migraines are stress, sleep deprivation, weather changes, around 2017, she was getting frequent migraine headaches, her typical migraine are left-sided severe pounding headache with associated light noise sensitivity, nauseous, she was seen by neurologist, giving preventive medications Topamax 50 mg twice a day since 2018, also use Cefaly device, which has helpful,   For a while she was taking frequent over-the-counter medications ibuprofen, Excedrin Migraine, 20 migraine headache days in 1 month, she brought a migraine journal, she is now having 7-12 migraine days each month, takes Zomig 3-4 times each month as needed, but still taking frequent Excedrin Migraine, ibuprofen,   She has never tried Maxalt in the past, limited response to Imitrex.   UPDATE January 04 2019: She now has migraine 4-6 /month, add on Ajovy has made big difference, previously she was having  18-20 migraine days each month, her migraine is better controlled with Maxalt,  UPDATE May 2nd 2024: She had transient memory loss on March 15th, lasting for 5 hours, witnessed by her roommate, presented to ER,   Roommate woke up from his nap, found she was not at home, she was lost for few hours, would not pick up her phone, she was walking her dog for few hours, but has no memory of it, kept on repeating herself,  acting strangely.  Presented to ER, CT head was normal.  Labs, UDS was negative, WBC was low 2.8, Hg 12.2, CMP was normal.  She was receiving BOTOX  injection to her right calf at [facility] by Dr. Lieutenant Diego for right lower extremity focal dystonia, last injection was on August 18 2021, used BOTOX 450 units.  She complains of excessive stress.  Recent TSH was 8.33, see endocrinologist,   UPDATE Nov 4th 2024: TSH  0.040, elevated Free T4. Thyroid dose was adjusted accordingly.  She was out with her friend, she was acting strange around March 25th 2024, she was going to sleep, 2 second at a time, woke up, making comments that does not make sense, lasted about one day, was noted by her friends.  She described episode, she woke up in the morning, in an awkward position, she has change pajama into a regular shirt, has no recollection of the event in July 2024.   Personally reviewed MRI brain w/wo in May 2024 was normal. EEG was normal.   She has 9 migraines in 30 days, taking Topamax 50mg  bid, tolerate it well, Maxalt  dissolvable as needed, Ubrevyl as needed was helpful  REVIEW OF SYSTEMS: Out of a complete 14 system review of symptoms, the patient complains only of the following symptoms, and all other reviewed systems are negative.  See HPI  PHYSICAL EXAM  Vitals:   10/07/22 1333  BP: 110/72  Pulse: 82  Weight: 128 lb 8 oz (58.3 kg)  Height: 5\' 6"  (1.676 m)    Body mass index is 20.74 kg/m.   PHYSICAL EXAMNIATION:  Gen: NAD, conversant, well nourised,  well groomed                     Cardiovascular: Regular rate rhythm, no peripheral edema, warm, nontender. Eyes: Conjunctivae clear without exudates or hemorrhage Neck: Supple, no carotid bruits. Pulmonary: Clear to auscultation bilaterally   NEUROLOGICAL EXAM:  MENTAL STATUS: Speech/cognition: Awake, alert oriented to history taking and casual conversation  CRANIAL NERVES: CN II: Visual fields are full to confrontation.  Pupils are round equal and briskly reactive to light. CN III, IV, VI: extraocular movement are normal. No ptosis. CN V: Facial sensation is intact to pinprick in all 3 divisions bilaterally. Corneal responses are intact.  CN VII: Face is symmetric with normal eye closure and smile. CN VIII: Hearing is normal to casual conversation CN IX, X: Palate elevates symmetrically. Phonation is normal. CN XI: Head turning and shoulder shrug are intact CN XII: Tongue is midline with normal movements and no atrophy.  MOTOR: There is no pronator drift of out-stretched arms. Muscle bulk and tone are normal. Muscle strength is normal.  REFLEXES: Reflexes are 2+ and symmetric at the biceps, triceps, knees, and ankles. Plantar responses are flexor.  SENSORY: Intact to light touch, pinprick, positional and vibratory sensation are intact in fingers and toes.  COORDINATION: Rapid alternating movements and fine finger movements are intact. There is no dysmetria on finger-to-nose and heel-knee-shin.    GAIT/STANCE: steady  ALLERGIES: No Known Allergies  HOME MEDICATIONS: Outpatient Medications Prior to Visit  Medication Sig Dispense Refill   Ascorbic Acid (VITAMIN C) 100 MG tablet Take 100 mg by mouth daily.     buPROPion (WELLBUTRIN SR) 200 MG 12 hr tablet Take 200 mg by mouth 2 (two) times daily.     cetirizine (ZYRTEC) 10 MG tablet Take 10 mg by mouth daily.     Ciclopirox 1 % shampoo Apply 1 application topically at bedtime.      clobetasol (TEMOVATE) 0.05 % external  solution Apply 1 application topically 2 (two) times daily.     clonazePAM (KLONOPIN) 0.5 MG tablet Take 0.25-0.75 mg by mouth See admin instructions. Take 0.75mg  qam, 0.25mg  at noon     Coenzyme Q10 (COQ10) 100 MG CAPS Take 100 mg by mouth 2 (two) times daily.     conjugated estrogens (PREMARIN) vaginal cream Place 1 applicator vaginally daily.     Esketamine HCl, 84 MG Dose, (SPRAVATO, 84 MG DOSE,) 28 MG/DEVICE SOPK Place into the nose.     fluocinonide ointment (LIDEX) 0.05 % Apply 1 application topically 2 (two) times daily as needed (Dermatitis). Hands     Fremanezumab-vfrm (AJOVY) 225 MG/1.5ML SOSY Inject 225 mg into the skin every 30 (thirty) days. 1.5 mL 11   hydroxychloroquine (PLAQUENIL) 200 MG tablet Take 300 mg by mouth daily.     ibuprofen (ADVIL) 200 MG tablet Take 200 mg by mouth as needed.     L-Methylfolate (DEPLIN PO) Take 1 tablet by mouth at bedtime.      levothyroxine (  SYNTHROID) 137 MCG tablet Take 137 mcg by mouth.     Multiple Vitamin (MULTIVITAMIN) tablet Take 1 tablet by mouth at bedtime.      Nerve Stimulator (CEFALY KIT) DEVI by Does not apply route daily.      Probiotic Product (PROBIOTIC PO) Take 1 capsule by mouth daily.     promethazine (PHENERGAN) 25 MG tablet Take 1 tablet (25 mg total) by mouth every 8 (eight) hours as needed for nausea or vomiting. 30 tablet 5   rizatriptan (MAXALT-MLT) 10 MG disintegrating tablet Take 1 tablet (10 mg total) by mouth as needed for migraine. May repeat in 2 hours if needed 9 tablet 11   Sod Fluoride-Potassium Nitrate 1.1-5 % PSTE Place 1 application onto teeth at bedtime.      topiramate (TOPAMAX) 50 MG tablet Take 1 tablet (50 mg total) by mouth 2 (two) times daily. 180 tablet 1   tranylcypromine (PARNATE) 10 MG tablet Take 10 mg by mouth in the morning, at noon, in the evening, and at bedtime.     traZODone (DESYREL) 50 MG tablet Take 25-50 mg by mouth at bedtime.     UBRELVY 100 MG TABS TAKE 100 MG BY MOUTH AS NEEDED (TAKE 1  HEADACHE AT ONSET OF HEADACHE, MAY REPEAT IN 2 HOURS IF NEEDED, MAX IS 200 MG IN 24 HOURS). 10 tablet 11   levothyroxine (SYNTHROID) 100 MCG tablet Take 100 mcg by mouth daily before breakfast. Take 1 tab daily except 1.5 tablet on Monday and Wednesday     ondansetron (ZOFRAN-ODT) 4 MG disintegrating tablet Take 1 tablet (4 mg total) by mouth every 8 (eight) hours as needed for nausea or vomiting. 20 tablet 0   No facility-administered medications prior to visit.    PAST MEDICAL HISTORY: Past Medical History:  Diagnosis Date   Anterior pituitary hyperfunction (HCC)    Anxiety    Benign neoplasm of skin    Depression    Focal dystonia    Gait disorder    Genetic torsion dystonia    Hypotension    Intractable chronic migraine without aura and without status migrainosus    Iodine hypothyroidism    Irregular menses    Other neutropenia (HCC)    Seborrheic dermatitis    Thrombosed external hemorrhoid     PAST SURGICAL HISTORY: Past Surgical History:  Procedure Laterality Date   BREAST BIOPSY Left 09/23/2020   HYMENECTOMY      FAMILY HISTORY: Family History  Problem Relation Age of Onset   Dementia Mother    Hypertension Mother    Colon cancer Maternal Grandmother    Lung cancer Paternal Grandfather    Dementia Maternal Grandfather    Hypertension Maternal Grandfather    Breast cancer Paternal Grandmother     SOCIAL HISTORY: Social History   Socioeconomic History   Marital status: Divorced    Spouse name: Not on file   Number of children: 0   Years of education: college   Highest education level: Master's degree (e.g., MA, MS, MEng, MEd, MSW, MBA)  Occupational History   Occupation: Does not work  Tobacco Use   Smoking status: Never   Smokeless tobacco: Never  Vaping Use   Vaping status: Never Used  Substance and Sexual Activity   Alcohol use: Yes    Alcohol/week: 2.0 standard drinks of alcohol    Types: 2 Glasses of wine per week    Comment: 1-2 glasses  wine a week   Drug use: Never   Sexual  activity: Yes  Other Topics Concern   Not on file  Social History Narrative   Lives at home alone.   Right-handed.   1 cup caffeine per day.   Social Determinants of Health   Financial Resource Strain: Not on file  Food Insecurity: Not on file  Transportation Needs: Not on file  Physical Activity: Not on file  Stress: Not on file  Social Connections: Not on file  Intimate Partner Violence: Not on file      Levert Feinstein, M.D. Ph.D.  [facility] [address] [place], [place] [address] Phone: [phone] Fax:      [phone]

## 2023-04-12 ENCOUNTER — Other Ambulatory Visit (HOSPITAL_COMMUNITY): Payer: Self-pay

## 2023-04-12 ENCOUNTER — Telehealth: Payer: Self-pay

## 2023-04-12 NOTE — Telephone Encounter (Signed)
Pharmacy Patient Advocate Encounter   Received notification from RX Request Messages that prior authorization for Rizatriptan Benzoate 10MG  dispersible tablets is required/requested.   Insurance verification completed.   The patient is insured through [facility] .   Per test claim: PA required; PA started via CoverMyMeds. KEY [id number] . Waiting for clinical questions to populate.

## 2023-04-13 ENCOUNTER — Other Ambulatory Visit (HOSPITAL_COMMUNITY): Payer: Self-pay

## 2023-04-13 ENCOUNTER — Telehealth: Payer: Self-pay

## 2023-04-13 NOTE — Telephone Encounter (Signed)
Clinical questions have been submitted-awaiting determination. 

## 2023-04-13 NOTE — Telephone Encounter (Signed)
Pharmacy Patient Advocate Encounter   Received notification from CoverMyMeds that prior authorization for Ubrelvy 100MG  tablets is required/requested.   Insurance verification completed.   The patient is insured through [facility] .   Per test claim: PA required; PA started via CoverMyMeds. KEY [id number] . Waiting for clinical questions to populate.

## 2023-04-15 ENCOUNTER — Other Ambulatory Visit (HOSPITAL_COMMUNITY): Payer: Self-pay

## 2023-04-15 NOTE — Telephone Encounter (Signed)
Linda from [employer] [facility] called to report denial from medical director if there are question call [phone] option 3 then option 1

## 2023-04-15 NOTE — Telephone Encounter (Signed)
Pharmacy Patient Advocate Encounter  Received notification from [facility] that Prior Authorization for Rizatriptan Benzoate 10MG  dispersible tablets has been DENIED.  Full denial letter will be uploaded to the media tab. See denial reason below.   PA #/Case ID/Reference #: PA Case ID #: [id number]

## 2023-04-15 NOTE — Telephone Encounter (Signed)
   Per CMM PA not needed-however per test claim stating needs PA-CMM is down so submitted paper form and faxed along with clinicals to [phone], awaiting determination.

## 2023-04-18 MED ORDER — RIZATRIPTAN BENZOATE 10 MG PO TABS: 10.0000 mg | ORAL_TABLET | ORAL | 6 refills | Status: DC | PRN

## 2023-04-18 NOTE — Telephone Encounter (Signed)
Meds ordered this encounter  Medications   rizatriptan (MAXALT) 10 MG tablet    Sig: Take 1 tablet (10 mg total) by mouth as needed for migraine. May repeat in 2 hours if needed    Dispense:  10 tablet    Refill:  6

## 2023-04-20 ENCOUNTER — Other Ambulatory Visit (HOSPITAL_COMMUNITY): Payer: Self-pay

## 2023-04-20 NOTE — Telephone Encounter (Signed)
Called [employer] to verify if PA is needed-per [employer] NO PA is needed for the Rizatriptan TABLET.

## 2023-04-22 DIAGNOSIS — E890 Postprocedural hypothyroidism: Secondary | ICD-10-CM | POA: Diagnosis not present

## 2023-04-29 ENCOUNTER — Other Ambulatory Visit: Payer: Self-pay | Admitting: Neurology

## 2023-05-02 DIAGNOSIS — F4322 Adjustment disorder with anxiety: Secondary | ICD-10-CM | POA: Diagnosis not present

## 2023-05-09 DIAGNOSIS — F4322 Adjustment disorder with anxiety: Secondary | ICD-10-CM | POA: Diagnosis not present

## 2023-05-12 DIAGNOSIS — M329 Systemic lupus erythematosus, unspecified: Secondary | ICD-10-CM | POA: Diagnosis not present

## 2023-05-12 DIAGNOSIS — Z79899 Other long term (current) drug therapy: Secondary | ICD-10-CM | POA: Diagnosis not present

## 2023-05-13 DIAGNOSIS — G248 Other dystonia: Secondary | ICD-10-CM | POA: Diagnosis not present

## 2023-05-16 DIAGNOSIS — L281 Prurigo nodularis: Secondary | ICD-10-CM | POA: Diagnosis not present

## 2023-05-16 DIAGNOSIS — L218 Other seborrheic dermatitis: Secondary | ICD-10-CM | POA: Diagnosis not present

## 2023-05-17 DIAGNOSIS — F4322 Adjustment disorder with anxiety: Secondary | ICD-10-CM | POA: Diagnosis not present

## 2023-05-18 DIAGNOSIS — F331 Major depressive disorder, recurrent, moderate: Secondary | ICD-10-CM | POA: Diagnosis not present

## 2023-05-19 ENCOUNTER — Ambulatory Visit
Admission: RE | Admit: 2023-05-19 | Discharge: 2023-05-19 | Disposition: A | Discharge status: Home / Self Care | Payer: BC Managed Care – PPO | Source: Ambulatory Visit | Attending: Family Medicine | Admitting: Family Medicine

## 2023-05-19 DIAGNOSIS — N6310 Unspecified lump in the right breast, unspecified quadrant: Secondary | ICD-10-CM

## 2023-05-19 DIAGNOSIS — N6313 Unspecified lump in the right breast, lower outer quadrant: Secondary | ICD-10-CM | POA: Diagnosis not present

## 2023-05-20 DIAGNOSIS — R569 Unspecified convulsions: Secondary | ICD-10-CM | POA: Diagnosis not present

## 2023-05-20 DIAGNOSIS — R4182 Altered mental status, unspecified: Secondary | ICD-10-CM | POA: Diagnosis not present

## 2023-05-21 DIAGNOSIS — R4182 Altered mental status, unspecified: Secondary | ICD-10-CM | POA: Diagnosis not present

## 2023-05-21 DIAGNOSIS — R569 Unspecified convulsions: Secondary | ICD-10-CM | POA: Diagnosis not present

## 2023-05-22 DIAGNOSIS — R4182 Altered mental status, unspecified: Secondary | ICD-10-CM | POA: Diagnosis not present

## 2023-05-22 DIAGNOSIS — R569 Unspecified convulsions: Secondary | ICD-10-CM | POA: Diagnosis not present

## 2023-05-23 ENCOUNTER — Other Ambulatory Visit: Payer: Self-pay | Admitting: Neurology

## 2023-05-23 DIAGNOSIS — R4182 Altered mental status, unspecified: Secondary | ICD-10-CM | POA: Diagnosis not present

## 2023-05-23 DIAGNOSIS — R569 Unspecified convulsions: Secondary | ICD-10-CM | POA: Diagnosis not present

## 2023-05-30 ENCOUNTER — Other Ambulatory Visit: Payer: Self-pay | Admitting: Neurology

## 2023-06-03 ENCOUNTER — Encounter: Payer: Self-pay | Admitting: Neurology

## 2023-06-07 DIAGNOSIS — E890 Postprocedural hypothyroidism: Secondary | ICD-10-CM | POA: Diagnosis not present

## 2023-06-07 DIAGNOSIS — F4322 Adjustment disorder with anxiety: Secondary | ICD-10-CM | POA: Diagnosis not present

## 2023-06-09 DIAGNOSIS — W010XXA Fall on same level from slipping, tripping and stumbling without subsequent striking against object, initial encounter: Secondary | ICD-10-CM | POA: Diagnosis not present

## 2023-06-09 DIAGNOSIS — Y9301 Activity, walking, marching and hiking: Secondary | ICD-10-CM | POA: Diagnosis not present

## 2023-06-09 DIAGNOSIS — S81011A Laceration without foreign body, right knee, initial encounter: Secondary | ICD-10-CM | POA: Diagnosis not present

## 2023-06-10 ENCOUNTER — Encounter (HOSPITAL_COMMUNITY): Payer: Self-pay | Admitting: Emergency Medicine

## 2023-06-10 ENCOUNTER — Emergency Department (HOSPITAL_COMMUNITY)
Admission: EM | Admit: 2023-06-10 | Discharge: 2023-06-10 | Disposition: A | Discharge status: Home / Self Care | Payer: BC Managed Care – PPO | Attending: Emergency Medicine | Admitting: Emergency Medicine

## 2023-06-10 ENCOUNTER — Other Ambulatory Visit: Payer: Self-pay

## 2023-06-10 ENCOUNTER — Emergency Department (HOSPITAL_COMMUNITY): Payer: BC Managed Care – PPO

## 2023-06-10 DIAGNOSIS — W010XXA Fall on same level from slipping, tripping and stumbling without subsequent striking against object, initial encounter: Secondary | ICD-10-CM | POA: Insufficient documentation

## 2023-06-10 DIAGNOSIS — S81011A Laceration without foreign body, right knee, initial encounter: Secondary | ICD-10-CM | POA: Diagnosis not present

## 2023-06-10 DIAGNOSIS — Z23 Encounter for immunization: Secondary | ICD-10-CM | POA: Insufficient documentation

## 2023-06-10 DIAGNOSIS — S8991XA Unspecified injury of right lower leg, initial encounter: Secondary | ICD-10-CM | POA: Diagnosis not present

## 2023-06-10 DIAGNOSIS — W19XXXA Unspecified fall, initial encounter: Secondary | ICD-10-CM

## 2023-06-10 DIAGNOSIS — E039 Hypothyroidism, unspecified: Secondary | ICD-10-CM | POA: Insufficient documentation

## 2023-06-10 MED ORDER — TETANUS-DIPHTH-ACELL PERTUSSIS 5-2.5-18.5 LF-MCG/0.5 IM SUSY
0.5000 mL | PREFILLED_SYRINGE | Freq: Once | INTRAMUSCULAR | Status: AC
  Administered 2023-06-10: 0.5 mL via INTRAMUSCULAR
  Filled 2023-06-10: qty 0.5

## 2023-06-10 MED ORDER — LIDOCAINE-EPINEPHRINE-TETRACAINE (LET) TOPICAL GEL
3.0000 mL | Freq: Once | TOPICAL | Status: AC
  Administered 2023-06-10: 3 mL via TOPICAL
  Filled 2023-06-10: qty 3

## 2023-06-10 MED ORDER — CEPHALEXIN 500 MG PO CAPS: 500.0000 mg | ORAL_CAPSULE | Freq: Two times a day (BID) | ORAL | 0 refills | Status: DC

## 2023-06-10 MED ORDER — CEPHALEXIN 500 MG PO CAPS: 500.0000 mg | ORAL_CAPSULE | Freq: Two times a day (BID) | ORAL | 0 refills | Status: AC

## 2023-06-10 MED ORDER — LIDOCAINE-EPINEPHRINE (PF) 2 %-1:200000 IJ SOLN
10.0000 mL | Freq: Once | INTRAMUSCULAR | Status: AC
  Administered 2023-06-10: 10 mL
  Filled 2023-06-10: qty 20

## 2023-06-10 NOTE — ED Provider Notes (Signed)
 [place] EMERGENCY DEPARTMENT AT [facility] Provider Note   CSN: [id number] Arrival date & time: 06/10/23  0018     History  Chief Complaint  Patient presents with   Laceration    Rose  Austin is a 53 y.o. female with past medical history of migraine, hypothyroidism presents to emergency department for evaluation of left knee injury following a fall.  She reports that she was walking her dog last evening in the dark when she tripped and fell onto the pavement.  She reports she was able to ambulate following fall and ambulated without difficulty to UC and ED.  She saw urgent care prior to arrival at ED as they recommended ED evaluation due to possibly seeing tendons.  She denies any other injury, head injury, headache, neck pain.   Laceration Associated symptoms: no fever      Home Medications Prior to Admission medications   Medication Sig Start Date End Date Taking? Authorizing Provider  Ascorbic Acid (VITAMIN C) 100 MG tablet Take 100 mg by mouth daily.    [provider]  buPROPion (WELLBUTRIN SR) 200 MG 12 hr tablet Take 200 mg by mouth 2 (two) times daily.    [provider]  cephALEXin  (KEFLEX ) 500 MG capsule Take 1 capsule (500 mg total) by mouth 2 (two) times daily for 2 days. 06/10/23 06/12/23  Minnie Tinnie BRAVO, PA  cetirizine (ZYRTEC) 10 MG tablet Take 10 mg by mouth daily.    [provider]  Ciclopirox 1 % shampoo Apply 1 application topically at bedtime.     [provider]  clobetasol (TEMOVATE) 0.05 % external solution Apply 1 application topically 2 (two) times daily.    [provider]  clonazePAM (KLONOPIN) 0.5 MG tablet Take 0.25-0.75 mg by mouth See admin instructions. Take 0.75mg  qam, 0.25mg  at noon    [provider]  Coenzyme Q10 (COQ10) 100 MG CAPS Take 100 mg by mouth 2 (two) times daily.    [provider]  conjugated estrogens (PREMARIN) vaginal cream Place 1 applicator vaginally  daily.    [provider]  Esketamine HCl, 84 MG Dose, (SPRAVATO, 84 MG DOSE,) 28 MG/DEVICE SOPK Place into the nose.    [provider]  fluocinonide ointment (LIDEX) 0.05 % Apply 1 application topically 2 (two) times daily as needed (Dermatitis). Hands    [provider]  Fremanezumab -vfrm (AJOVY ) 225 MG/1.5ML SOSY INJECT 225 MG INTO THE SKIN EVERY 30 (THIRTY) DAYS. 05/31/23   Gayland Lauraine PARAS, NP  hydroxychloroquine (PLAQUENIL) 200 MG tablet Take 300 mg by mouth daily.    [provider]  ibuprofen (ADVIL) 200 MG tablet Take 200 mg by mouth as needed.    [provider]  L-Methylfolate (DEPLIN PO) Take 1 tablet by mouth at bedtime.     [provider]  levothyroxine (SYNTHROID) 137 MCG tablet Take 137 mcg by mouth.    [provider]  Multiple Vitamin (MULTIVITAMIN) tablet Take 1 tablet by mouth at bedtime.     [provider]  Nerve Stimulator (CEFALY KIT) DEVI by Does not apply route daily.     [provider]  Probiotic Product (PROBIOTIC PO) Take 1 capsule by mouth daily.    [provider]  promethazine  (PHENERGAN ) 25 MG tablet TAKE 1 TABLET BY MOUTH EVERY 8 HOURS AS NEEDED FOR NAUSEA OR VOMITING. 05/24/23   Onita Duos, MD  rizatriptan  (MAXALT ) 10 MG tablet TAKE 1 TAB AT ONSET OF MIGRAINE. MAY REPEAT IN  2 HRS, IF NEEDED. MAX 2 TABS/DAY 30 DAY SUPPLY. 05/02/23   Gayland Lauraine PARAS, NP  rizatriptan  (MAXALT -MLT) 10 MG disintegrating tablet TAKE 1 TABLET BY MOUTH AS NEEDED FOR MIGRAINE. MAY REPEAT IN 2 HOURS IF NEEDED 04/20/23   Onita Duos, MD  Sod Fluoride-Potassium Nitrate 1.1-5 % PSTE Place 1 application onto teeth at bedtime.     [provider]  topiramate  (TOPAMAX ) 100 MG tablet Take 1 tablet (100 mg total) by mouth 2 (two) times daily. 04/11/23   Onita Duos, MD  tranylcypromine (PARNATE) 10 MG tablet Take 10 mg by mouth in the morning, at noon, in the evening, and at bedtime.    [provider]  traZODone (DESYREL) 50 MG tablet Take 25-50 mg by mouth at bedtime.    [provider]  UBRELVY  100 MG TABS TAKE 100 MG BY MOUTH AS NEEDED (TAKE 1 HEADACHE AT ONSET OF HEADACHE, MAY REPEAT IN 2 HOURS IF NEEDED, MAX IS 200 MG IN 24 HOURS). 02/08/23   Gayland Lauraine PARAS, NP      Allergies    Patient has no known allergies.    Review of Systems   Review of Systems  Constitutional:  Negative for chills, fatigue and fever.  Respiratory:  Negative for cough, chest tightness, shortness of breath and wheezing.   Cardiovascular:  Negative for chest pain and palpitations.  Gastrointestinal:  Negative for abdominal pain, constipation, diarrhea, nausea and vomiting.  Musculoskeletal:  Negative for neck pain.  Skin:  Positive for wound.  Neurological:  Negative for dizziness, seizures, weakness, light-headedness, numbness and headaches.    Physical Exam Updated Vital Signs BP (!) 85/61 (BP Location: Left Arm)   Pulse 65   Temp 97.9 F (36.6 C) (Oral)   Resp 18   Ht 5' 6 (1.676 m)   Wt 59 kg   SpO2 100%   BMI 20.98 kg/m  Physical Exam Vitals and nursing note reviewed.  Constitutional:      General: She is not in acute distress.    Appearance: Normal appearance.  HENT:     Head: Normocephalic and atraumatic.  Eyes:     Conjunctiva/sclera: Conjunctivae normal.  Cardiovascular:     Rate and Rhythm: Normal rate.     Pulses:          Dorsalis pedis pulses are 2+ on the right side and 2+ on the left side.  Pulmonary:     Effort: Pulmonary effort is normal. No respiratory distress.     Breath sounds: Normal breath sounds.  Chest:     Chest wall: No tenderness.  Abdominal:     General: Bowel sounds are normal. There is no distension.     Palpations: Abdomen is soft.     Tenderness: There is no abdominal tenderness.  Musculoskeletal:     Cervical back: Normal. No rigidity or bony tenderness. Normal range of motion.     Thoracic back: Normal. No bony tenderness.  Normal range of motion.     Lumbar back: Normal. No tenderness or bony tenderness. Normal range of motion.     Right lower leg: No edema.     Left lower leg: No edema.       Legs:     Comments: 5/5 strength with flexion and extension of knee, hip, dorsi and plantar flexion BLE Sensation equal and intact Able to ambulate without difficulty  Skin:    General: Skin is warm.     Capillary Refill: Capillary refill takes less than  2 seconds.     Coloration: Skin is not jaundiced or pale.  Neurological:     Mental Status: She is alert and oriented to person, place, and time. Mental status is at baseline.    ED Results / Procedures / Treatments   Labs (all labs ordered are listed, but only abnormal results are displayed) Labs Reviewed - No data to display  EKG None  Radiology DG Knee Complete 4 Views Right Result Date: 06/10/2023 CLINICAL DATA:  Recent trip and fall with right knee laceration, initial encounter EXAM: RIGHT KNEE - COMPLETE 4+ VIEW COMPARISON:  None Available. FINDINGS: No acute fracture or dislocation is noted. No soft tissue swelling is seen. IMPRESSION: No acute fracture noted. Electronically Signed   By: Oneil Devonshire M.D.   On: 06/10/2023 01:16    Procedures .Laceration Repair  Date/Time: 06/10/2023 10:56 AM  Performed by: Minnie Tinnie BRAVO, PA Authorized by: Minnie Tinnie BRAVO, PA   Consent:    Consent obtained:  Verbal   Consent given by:  Patient   Risks, benefits, and alternatives were discussed: yes     Risks discussed:  Infection, pain, poor cosmetic result and poor wound healing   Alternatives discussed:  No treatment Universal protocol:    Procedure explained and questions answered to patient or proxy's satisfaction: yes     Patient identity confirmed:  Verbally with patient and arm band Anesthesia:    Anesthesia method:  Topical application and local infiltration   Topical anesthetic:  LET   Local anesthetic:  Lidocaine  2% WITH epi Laceration details:     Location:  Leg   Leg location: right knee.   Length (cm):  4 Pre-procedure details:    Preparation:  Patient was prepped and draped in usual sterile fashion Treatment:    Area cleansed with:  Saline   Amount of cleaning:  Standard   Irrigation solution:  Sterile saline   Irrigation volume:  300   Irrigation method:  Pressure wash Skin repair:    Repair method:  Sutures   Suture size:  5-0   Suture material:  Prolene   Suture technique:  Simple interrupted   Number of sutures:  8 Approximation:    Laceration repair approximation: Partially closed. Could not close abrasion d/t skin loss/thinness. Repair type:    Repair type:  Simple Post-procedure details:    Dressing:  Non-adherent dressing   Procedure completion:  Tolerated well, no immediate complications     Medications Ordered in ED Medications  lidocaine -EPINEPHrine  (XYLOCAINE  W/EPI) 2 %-1:200000 (PF) injection 10 mL (has no administration in time range)  lidocaine -EPINEPHrine -tetracaine  (LET) topical gel (3 mLs Topical Given 06/10/23 0958)  Tdap (BOOSTRIX ) injection 0.5 mL (0.5 mLs Intramuscular Given 06/10/23 0955)    ED Course/ Medical Decision Making/ A&P Clinical Course as of 06/10/23 1114  Fri Jun 10, 2023  1113 BP(!): 85/61 At bedside, retook BP and got 98/26mmHg. [LB]    Clinical Course User Index [LB] Minnie Tinnie BRAVO, PA                                 Medical Decision Making Amount and/or Complexity of Data Reviewed Radiology: ordered.  Risk Prescription drug management.   Patient presents to the ED for concern of right knee laceration after a fall yesterday, this involves an extensive number of treatment options, and is a complaint that carries with it a high risk of complications and morbidity.  The differential diagnosis includes fracture, laceration repair, septic joint   Co morbidities that complicate the patient evaluation  Noted in HPI   Additional history obtained:  Additional history  obtained from Nursing and Outside Medical Records   External records from outside source obtained and reviewed including  Triage RN note Recent medical evaluations and medication list    Imaging Studies ordered:  I ordered imaging studies including XR right knee I independently visualized and interpreted imaging which showed negative for fracture, dislocation, nor soft tissue swelling I agree with the radiologist interpretation    Medicines ordered and prescription drug management:  I ordered medication including LET, lido/epi, Tdap, and keflex   for laceration repair, antibiotic/tetanus prophylaxis Reevaluation of the patient after these medicines showed that the patient improved I have reviewed the patients home medicines and have made adjustments as needed    Problem List / ED Course:  Fall, initial encounter Laceration to right knee Lac repair as noted above No additional injuries found.  X-ray negative for fracture or effusion Pulses intact.  Motor and sensory intact. As patient has had wound open for over 24 hours will provide antibiotic prophylaxis for 2 days   Reevaluation:  After the interventions noted above, I reevaluated the patient and found that they have :improved   Social Determinants of Health:  Has PCP follow-up   Dispostion:  After consideration of the diagnostic results and the patients response to treatment, I feel that the patent would benefit from outpatient management with antibiotic and tetanus prophylaxis.   Dr. Mannie individually assessed patient and agrees with plan Final Clinical Impression(s) / ED Diagnoses Final diagnoses:  Laceration of right knee, initial encounter  Fall, initial encounter    Rx / DC Orders ED Discharge Orders          Ordered    cephALEXin  (KEFLEX ) 500 MG capsule  2 times daily,   Status:  Discontinued        06/10/23 0952    cephALEXin  (KEFLEX ) 500 MG capsule  2 times daily        06/10/23 0953              Minnie Tinnie BRAVO, PA 06/10/23 1114    Mannie Pac T, DO 06/11/23 1123

## 2023-06-10 NOTE — ED Triage Notes (Signed)
 Pt sent her from UC after tripping causing a large laceration to her right knee. Pt states the dr at [facility] stated he could see tendons. Wound currently wrapped and pt states that she is ambulatory.

## 2023-06-10 NOTE — Discharge Instructions (Addendum)
 Thank you for letting us  evaluate you today.  We have stitched up as much as we could with 8 sutures.  Please follow-up with primary care, urgent care or emergency department in 7 to 10 days to remove the sutures.  Please avoid submerging wound in dirty water, hot tub, pool.  Please pick up antibiotic from [facility]

## 2023-06-14 ENCOUNTER — Encounter: Payer: Self-pay | Admitting: Neurology

## 2023-06-14 DIAGNOSIS — G43709 Chronic migraine without aura, not intractable, without status migrainosus: Secondary | ICD-10-CM

## 2023-06-14 DIAGNOSIS — R410 Disorientation, unspecified: Secondary | ICD-10-CM

## 2023-06-14 NOTE — Telephone Encounter (Signed)
EEG completed. Thank you

## 2023-06-14 NOTE — Procedures (Signed)
 Clinical History:  53 year old female with complaints of migraine headaches with a PMH of hypothyroidism. She has had a history of migraine headaches since her 20's, trigger for migraines is stress, sleep deprivation, and weather changes, around 2017, she was getting frequent migraine headaches, her typical migraines are left-sided severe pounding headache with associated light noise sensitivity, nauseous, she was seen by a neurologist, giving preventative medications Topamax  50 mg twice a day since 2018, also uses a Cefaly device, which was helpful. On 08/30/22 she was out with her friend, she was acting strange, she was going to sleep, 2 seconds at a time, woke up, making comments that does not make sense, lasted about one day, was noted by her friends. Previous EEG was normal.   INTERMITTENT MONITORING with VIDEO TECHNICAL SUMMARY: This AVEEG was performed using equipment provided by [employer] utilizing Bluetooth ( Trackit ) amplifiers with continuous EEGT attended video collection using encrypted remote transmission via [employer] secured cellular tower network with data rates for each AVEEG performed. This is a therapist, music AVEEG, obtained, according to the 10-20 international electrode placement system, reformatted digitally into referential and bipolar montages. Data was acquired with a minimum of 21 bipolar connections and sampled at a minimum rate of 250 cycles per second per channel, maximum rate of 450 cycles per second per channel and two channels for EKG. The entire VEEG study was recorded through cable and or radio telemetry for subsequent analysis. Specified epochs of the AVEEG data were identified at the direction of the subject by the depression of a push button by the patient. Each patients event file included data acquired two minutes prior to the push button activation and continuing until two minutes afterwards. AVEEG files were reviewed on [facility] server,  Licensed Software provided by [employer] with a digital high frequency filter set at 70 Hz and a low frequency filter set at 1 Hz with a paper speed of 6mm/s resulting in 10 seconds per digital page. This entire AVEEG was reviewed by the EEG Technologist. Random time samples, random sleep samples, clips, patient initiated push button files with included patient daily diary logs, EEG Technologist pruned data was reviewed and verified for accuracy and validity by the governing reading neurologist in full details. This AEEGV was fully compliant with all requirements for CPT 97500 for setup, patient education, take down and administered by an EEG technologist.   Long-Term EEG with Video was monitored intermittently by a qualified EEG technologist for the entirety of the recording; quality check-ins were performed at a minimum of every two hours, checking and documenting real-time data and video to assure the integrity and quality of the recording (e.g., camera position, electrode integrity and impedance), and identify the need for maintenance. For intermittent monitoring, an EEG Technologist monitored no more than 12 patients concurrently. Diagnostic video was captured at least 80% of the time during the recording.   PATIENT EVENTS: A notation was made 10 times. Patient log was reviewed with the patient at disconnect with the intent to reconcile events. Patient noted events for migraines, nausea, fatigue, dizziness, hypotension, narcoleptic episode and clumsiness.   TECHNOLOGIST EVENTS: No clear epileptiform activity was detected by the reviewing neurodiagnostic technologist for further review. TIME SAMPLES: 10-minutes of every 2 hours recorded are reviewed as random time samples.  SLEEP SAMPLES: 5-minutes of every 24 hours recorded are reviewed as random sleep samples.  AWAKE: At maximal level of alertness, the posterior dominant background activity was continuous, reactive, low voltage  rhythm of 12 Hz. This  was symmetric, well-modulated, and attenuated with eye opening. Diffuse, symmetric, frontocentral beta range activity was present.  SLEEP:  N1 Sleep (Stage 1) was observed and characterized by the disappearance of alpha rhythm and the appearance of vertex activity.  N2 Sleep (Stage 2) was observed and characterized by vertex waves, K-complexes, and sleep spindles.  N3 (Stage 3) sleep was observed and characterized by high amplitude Delta activity of 20%.  REM sleep was observed.   EKG: There were no arrhythmias or abnormalities noted during this recording.   Impression: This is a normal 72 hours ambulatory video EEG. There were a total of 10 events described as migraines, dizziness, fatigue, hypotension with no EEG correlate. These events are nonepileptic. There were no epileptiform discharges, electrographic seizures and no focal or generalized slowing. Normal EEGs.   Jarod Bozzo, MD [facility]

## 2023-06-20 DIAGNOSIS — Z4802 Encounter for removal of sutures: Secondary | ICD-10-CM | POA: Diagnosis not present

## 2023-06-30 DIAGNOSIS — D839 Common variable immunodeficiency, unspecified: Secondary | ICD-10-CM | POA: Diagnosis not present

## 2023-06-30 DIAGNOSIS — Z13220 Encounter for screening for lipoid disorders: Secondary | ICD-10-CM | POA: Diagnosis not present

## 2023-06-30 DIAGNOSIS — Z0000 Encounter for general adult medical examination without abnormal findings: Secondary | ICD-10-CM | POA: Diagnosis not present

## 2023-06-30 DIAGNOSIS — G43009 Migraine without aura, not intractable, without status migrainosus: Secondary | ICD-10-CM | POA: Diagnosis not present

## 2023-06-30 DIAGNOSIS — E039 Hypothyroidism, unspecified: Secondary | ICD-10-CM | POA: Diagnosis not present

## 2023-06-30 DIAGNOSIS — G249 Dystonia, unspecified: Secondary | ICD-10-CM | POA: Diagnosis not present

## 2023-06-30 DIAGNOSIS — E559 Vitamin D deficiency, unspecified: Secondary | ICD-10-CM | POA: Diagnosis not present

## 2023-07-07 ENCOUNTER — Other Ambulatory Visit: Payer: Self-pay | Admitting: Neurology

## 2023-07-21 DIAGNOSIS — M19049 Primary osteoarthritis, unspecified hand: Secondary | ICD-10-CM | POA: Diagnosis not present

## 2023-07-21 DIAGNOSIS — R768 Other specified abnormal immunological findings in serum: Secondary | ICD-10-CM | POA: Diagnosis not present

## 2023-07-21 DIAGNOSIS — M79643 Pain in unspecified hand: Secondary | ICD-10-CM | POA: Diagnosis not present

## 2023-07-21 DIAGNOSIS — M151 Heberden's nodes (with arthropathy): Secondary | ICD-10-CM | POA: Diagnosis not present

## 2023-07-21 DIAGNOSIS — M1990 Unspecified osteoarthritis, unspecified site: Secondary | ICD-10-CM | POA: Diagnosis not present

## 2023-07-21 DIAGNOSIS — R21 Rash and other nonspecific skin eruption: Secondary | ICD-10-CM | POA: Diagnosis not present

## 2023-07-21 DIAGNOSIS — Z79899 Other long term (current) drug therapy: Secondary | ICD-10-CM | POA: Diagnosis not present

## 2023-07-22 DIAGNOSIS — G249 Dystonia, unspecified: Secondary | ICD-10-CM | POA: Diagnosis not present

## 2023-07-22 DIAGNOSIS — G248 Other dystonia: Secondary | ICD-10-CM | POA: Diagnosis not present

## 2023-08-26 DIAGNOSIS — E890 Postprocedural hypothyroidism: Secondary | ICD-10-CM | POA: Diagnosis not present

## 2023-09-01 DIAGNOSIS — Z8639 Personal history of other endocrine, nutritional and metabolic disease: Secondary | ICD-10-CM | POA: Diagnosis not present

## 2023-09-01 DIAGNOSIS — E890 Postprocedural hypothyroidism: Secondary | ICD-10-CM | POA: Diagnosis not present

## 2023-09-06 DIAGNOSIS — G4719 Other hypersomnia: Secondary | ICD-10-CM | POA: Diagnosis not present

## 2023-09-06 DIAGNOSIS — G478 Other sleep disorders: Secondary | ICD-10-CM | POA: Diagnosis not present

## 2023-09-06 DIAGNOSIS — R4189 Other symptoms and signs involving cognitive functions and awareness: Secondary | ICD-10-CM | POA: Diagnosis not present

## 2023-09-13 DIAGNOSIS — F418 Other specified anxiety disorders: Secondary | ICD-10-CM | POA: Diagnosis not present

## 2023-09-13 DIAGNOSIS — R41840 Attention and concentration deficit: Secondary | ICD-10-CM | POA: Diagnosis not present

## 2023-09-13 DIAGNOSIS — R4189 Other symptoms and signs involving cognitive functions and awareness: Secondary | ICD-10-CM | POA: Diagnosis not present

## 2023-09-13 DIAGNOSIS — G478 Other sleep disorders: Secondary | ICD-10-CM | POA: Diagnosis not present

## 2023-09-22 DIAGNOSIS — M25512 Pain in left shoulder: Secondary | ICD-10-CM | POA: Diagnosis not present

## 2023-09-29 DIAGNOSIS — R4189 Other symptoms and signs involving cognitive functions and awareness: Secondary | ICD-10-CM | POA: Diagnosis not present

## 2023-10-26 ENCOUNTER — Encounter: Payer: Self-pay | Admitting: Neurology

## 2023-10-26 ENCOUNTER — Ambulatory Visit: Payer: BC Managed Care – PPO | Admitting: Neurology

## 2023-10-26 NOTE — Progress Notes (Deleted)
 ASSESSMENT AND PLAN 53 y.o. year old female   Chronic migraine headache Ajovy  for migraine preventative,  Higher dose of Topamax  100 mg twice a day Maxalt  10 mg, phenergan  prn Ubrevyl Previously tried and failed: Excedrin Migraine, Zomig, Imitrex, Maxalt   Advise her not use rescue medicine more than 3 times a week to avoid medicine rebound of headaches.   Worsening memory loss, transient confusion episode, in the setting of worsening mood disorder  MRI of the brain was normal in May 2024  EEG was normal.   Ordered 72 hours ambulatory EEG.   Hyperthyroidism  Low TSH, elevated free T4, thyrodism dose was adjusted.     HISTORY: Rose  Austin is a 53 year old female, seen in request by her primary care PA Darnelle Elders, for evaluation of migraine headaches, initial evaluation was on May 12, 2018.   I have reviewed and summarized the referring note from the referring physician.  She had a past medical history of hypothyroidism, on supplement.   She had a history of migraine headaches since her 54s, trigger for migraines are stress, sleep deprivation, weather changes, around 2017, she was getting frequent migraine headaches, her typical migraine are left-sided severe pounding headache with associated light noise sensitivity, nauseous, she was seen by neurologist, giving preventive medications Topamax  50 mg twice a day since 2018, also use Cefaly device, which has helpful,   For a while she was taking frequent over-the-counter medications ibuprofen, Excedrin Migraine, 20 migraine headache days in 1 month, she brought a migraine journal, she is now having 7-12 migraine days each month, takes Zomig 3-4 times each month as needed, but still taking frequent Excedrin Migraine, ibuprofen,   She has never tried Maxalt  in the past, limited response to Imitrex.   UPDATE January 04 2019: She now has migraine 4-6 /month, add on Ajovy  has made big difference, previously she was having  18-20 migraine days each month, her migraine is better controlled with Maxalt ,  UPDATE May 2nd 2024: She had transient memory loss on March 15th, lasting for 5 hours, witnessed by her roommate, presented to ER,   Roommate woke up from his nap, found she was not at home, she was lost for few hours, would not pick up her phone, she was walking her dog for few hours, but has no memory of it, kept on repeating herself,  acting strangely.  Presented to ER, CT head was normal.  Labs, UDS was negative, WBC was low 2.8, Hg 12.2, CMP was normal.  She was receiving BOTOX  injection to her right calf at [facility] by Dr. Charon Copper for right lower extremity focal dystonia, last injection was on August 18 2021, used BOTOX 450 units.  She complains of excessive stress.  Recent TSH was 8.33, see endocrinologist,   UPDATE Nov 4th 2024: TSH  0.040, elevated Free T4. Thyroid  dose was adjusted accordingly.  She was out with her friend, she was acting strange around March 25th 2024, she was going to sleep, 2 second at a time, woke up, making comments that does not make sense, lasted about one day, was noted by her friends.  She described episode, she woke up in the morning, in an awkward position, she has change pajama into a regular shirt, has no recollection of the event in July 2024.   Personally reviewed MRI brain w/wo in May 2024 was normal. EEG was normal.   She has 9 migraines in 30 days, taking Topamax  50mg  bid, tolerate it well, Maxalt   dissolvable as needed, Ubrevyl as needed was helpful  Update Oct 26, 2023 SS: 72-hour ambulatory video EEG was normal.  There were 10 events described as migraine, dizziness, fatigue, hypotension with no EEG correlation, these events are nonepileptic.  REVIEW OF SYSTEMS: Out of a complete 14 system review of symptoms, the patient complains only of the following symptoms, and all other reviewed systems are negative.  See HPI  PHYSICAL EXAM  Vitals:    10/07/22 1333  BP: 110/72  Pulse: 82  Weight: 128 lb 8 oz (58.3 kg)  Height: 5\' 6"  (1.676 m)    Body mass index is 20.74 kg/m.   PHYSICAL EXAMNIATION:  Gen: NAD, conversant, well nourised, well groomed                     Cardiovascular: Regular rate rhythm, no peripheral edema, warm, nontender. Eyes: Conjunctivae clear without exudates or hemorrhage Neck: Supple, no carotid bruits. Pulmonary: Clear to auscultation bilaterally   NEUROLOGICAL EXAM:  MENTAL STATUS: Speech/cognition: Awake, alert oriented to history taking and casual conversation  CRANIAL NERVES: CN II: Visual fields are full to confrontation.  Pupils are round equal and briskly reactive to light. CN III, IV, VI: extraocular movement are normal. No ptosis. CN V: Facial sensation is intact to pinprick in all 3 divisions bilaterally. Corneal responses are intact.  CN VII: Face is symmetric with normal eye closure and smile. CN VIII: Hearing is normal to casual conversation CN IX, X: Palate elevates symmetrically. Phonation is normal. CN XI: Head turning and shoulder shrug are intact CN XII: Tongue is midline with normal movements and no atrophy.  MOTOR: There is no pronator drift of out-stretched arms. Muscle bulk and tone are normal. Muscle strength is normal.  REFLEXES: Reflexes are 2+ and symmetric at the biceps, triceps, knees, and ankles. Plantar responses are flexor.  SENSORY: Intact to light touch, pinprick, positional and vibratory sensation are intact in fingers and toes.  COORDINATION: Rapid alternating movements and fine finger movements are intact. There is no dysmetria on finger-to-nose and heel-knee-shin.    GAIT/STANCE: steady  ALLERGIES: No Known Allergies  HOME MEDICATIONS: Outpatient Medications Prior to Visit  Medication Sig Dispense Refill   Ascorbic Acid (VITAMIN C) 100 MG tablet Take 100 mg by mouth daily.     buPROPion (WELLBUTRIN SR) 200 MG 12 hr tablet Take 200 mg by mouth 2  (two) times daily.     cetirizine (ZYRTEC) 10 MG tablet Take 10 mg by mouth daily.     Ciclopirox 1 % shampoo Apply 1 application topically at bedtime.      clobetasol (TEMOVATE) 0.05 % external solution Apply 1 application topically 2 (two) times daily.     clonazePAM (KLONOPIN) 0.5 MG tablet Take 0.25-0.75 mg by mouth See admin instructions. Take 0.75mg  qam, 0.25mg  at noon     Coenzyme Q10 (COQ10) 100 MG CAPS Take 100 mg by mouth 2 (two) times daily.     conjugated estrogens (PREMARIN) vaginal cream Place 1 applicator vaginally daily.     Esketamine HCl, 84 MG Dose, (SPRAVATO, 84 MG DOSE,) 28 MG/DEVICE SOPK Place into the nose.     fluocinonide ointment (LIDEX) 0.05 % Apply 1 application topically 2 (two) times daily as needed (Dermatitis). Hands     Fremanezumab -vfrm (AJOVY ) 225 MG/1.5ML SOSY INJECT 225 MG INTO THE SKIN EVERY 30 (THIRTY) DAYS. 1.5 mL 11   hydroxychloroquine (PLAQUENIL) 200 MG tablet Take 300 mg by mouth daily.     ibuprofen (  ADVIL) 200 MG tablet Take 200 mg by mouth as needed.     L-Methylfolate (DEPLIN PO) Take 1 tablet by mouth at bedtime.      levothyroxine (SYNTHROID) 137 MCG tablet Take 137 mcg by mouth.     Multiple Vitamin (MULTIVITAMIN) tablet Take 1 tablet by mouth at bedtime.      Nerve Stimulator (CEFALY KIT) DEVI by Does not apply route daily.      Probiotic Product (PROBIOTIC PO) Take 1 capsule by mouth daily.     promethazine  (PHENERGAN ) 25 MG tablet TAKE 1 TABLET BY MOUTH EVERY 8 HOURS AS NEEDED FOR NAUSEA OR VOMITING. 30 tablet 5   rizatriptan  (MAXALT ) 10 MG tablet TAKE 1 TAB AT ONSET OF MIGRAINE. MAY REPEAT IN 2 HRS, IF NEEDED. MAX 2 TABS/DAY 30 DAY SUPPLY. 10 tablet 5   rizatriptan  (MAXALT -MLT) 10 MG disintegrating tablet TAKE 1 TABLET BY MOUTH AS NEEDED FOR MIGRAINE. MAY REPEAT IN 2 HOURS IF NEEDED 9 tablet 11   Sod Fluoride-Potassium Nitrate 1.1-5 % PSTE Place 1 application onto teeth at bedtime.      topiramate  (TOPAMAX ) 100 MG tablet Take 1 tablet (100  mg total) by mouth 2 (two) times daily. 180 tablet 3   tranylcypromine (PARNATE) 10 MG tablet Take 10 mg by mouth in the morning, at noon, in the evening, and at bedtime.     traZODone (DESYREL) 50 MG tablet Take 25-50 mg by mouth at bedtime.     UBRELVY  100 MG TABS TAKE 100 MG BY MOUTH AS NEEDED (TAKE 1 HEADACHE AT ONSET OF HEADACHE, MAY REPEAT IN 2 HOURS IF NEEDED, MAX IS 200 MG IN 24 HOURS). 10 tablet 11   No facility-administered medications prior to visit.    PAST MEDICAL HISTORY: Past Medical History:  Diagnosis Date   Anterior pituitary hyperfunction (HCC)    Anxiety    Benign neoplasm of skin    Depression    Focal dystonia    Gait disorder    Genetic torsion dystonia    Hypotension    Intractable chronic migraine without aura and without status migrainosus    Iodine hypothyroidism    Irregular menses    Other neutropenia (HCC)    Seborrheic dermatitis    Thrombosed external hemorrhoid     PAST SURGICAL HISTORY: Past Surgical History:  Procedure Laterality Date   BREAST BIOPSY Left 09/23/2020   HYMENECTOMY      FAMILY HISTORY: Family History  Problem Relation Age of Onset   Dementia Mother    Hypertension Mother    Colon cancer Maternal Grandmother    Lung cancer Paternal Grandfather    Dementia Maternal Grandfather    Hypertension Maternal Grandfather    Breast cancer Paternal Grandmother     SOCIAL HISTORY: Social History   Socioeconomic History   Marital status: Divorced    Spouse name: Not on file   Number of children: 0   Years of education: college   Highest education level: Master's degree (e.g., MA, MS, MEng, MEd, MSW, MBA)  Occupational History   Occupation: Does not work  Tobacco Use   Smoking status: Never   Smokeless tobacco: Never  Vaping Use   Vaping status: Never Used  Substance and Sexual Activity   Alcohol use: Yes    Alcohol/week: 2.0 standard drinks of alcohol    Types: 2 Glasses of wine per week    Comment: 1-2 glasses wine  a week   Drug use: Never   Sexual activity: Yes  Other Topics Concern   Not on file  Social History Narrative   Lives at home alone.   Right-handed.   1 cup caffeine per day.   Social Drivers of Corporate investment banker Strain: Not on file  Food Insecurity: Not on file  Transportation Needs: Not on file  Physical Activity: Not on file  Stress: Not on file  Social Connections: Not on file  Intimate Partner Violence: Not on file   Jeanmarie Millet, Maritza Sidles, DNP  [facility] [address], [address] [place], [place] [address] [phone]

## 2023-11-04 DIAGNOSIS — G248 Other dystonia: Secondary | ICD-10-CM | POA: Diagnosis not present

## 2023-12-05 ENCOUNTER — Telehealth: Payer: Self-pay | Admitting: Neurology

## 2023-12-05 NOTE — Telephone Encounter (Signed)
 Pt has r/s her f/u and states her pharmacy has informed her that a PA will be needed for her Ajovy 

## 2023-12-08 ENCOUNTER — Other Ambulatory Visit (HOSPITAL_COMMUNITY): Payer: Self-pay

## 2023-12-12 ENCOUNTER — Other Ambulatory Visit (HOSPITAL_COMMUNITY): Payer: Self-pay

## 2023-12-12 ENCOUNTER — Telehealth: Payer: Self-pay

## 2023-12-12 NOTE — Telephone Encounter (Signed)
 Pharmacy Patient Advocate Encounter   Received notification from Physician's Office that prior authorization for AJOVY  (fremanezumab -vfrm) injection 225MG /1.5ML syringes is required/requested.   Insurance verification completed.   The patient is insured through [facility] .   Per test claim: PA required; PA submitted to above mentioned insurance via CoverMyMeds Key/confirmation #/EOC [id number] Status is pending

## 2023-12-12 NOTE — Telephone Encounter (Signed)
 Pharmacy Patient Advocate Encounter  Received notification from [facility] that Prior Authorization for AJOVY  (fremanezumab -vfrm) injection 225MG /1.5ML syringes has been APPROVED from 12/12/2023 to 12/11/2024. Ran test claim, Copay is $0. This test claim was processed through [facility]- copay amounts may vary at other pharmacies due to pharmacy/plan contracts, or as the patient moves through the different stages of their insurance plan.   PA #/Case ID/Reference #: PA Case ID #: [id number]

## 2023-12-23 ENCOUNTER — Ambulatory Visit: Admitting: Neurology

## 2024-01-20 DIAGNOSIS — F331 Major depressive disorder, recurrent, moderate: Secondary | ICD-10-CM | POA: Diagnosis not present

## 2024-01-26 DIAGNOSIS — E890 Postprocedural hypothyroidism: Secondary | ICD-10-CM | POA: Diagnosis not present

## 2024-01-27 DIAGNOSIS — G248 Other dystonia: Secondary | ICD-10-CM | POA: Diagnosis not present

## 2024-03-07 DIAGNOSIS — E890 Postprocedural hypothyroidism: Secondary | ICD-10-CM | POA: Diagnosis not present

## 2024-03-21 DIAGNOSIS — D224 Melanocytic nevi of scalp and neck: Secondary | ICD-10-CM | POA: Diagnosis not present

## 2024-03-21 DIAGNOSIS — D2261 Melanocytic nevi of right upper limb, including shoulder: Secondary | ICD-10-CM | POA: Diagnosis not present

## 2024-03-21 DIAGNOSIS — D225 Melanocytic nevi of trunk: Secondary | ICD-10-CM | POA: Diagnosis not present

## 2024-03-21 DIAGNOSIS — L821 Other seborrheic keratosis: Secondary | ICD-10-CM | POA: Diagnosis not present

## 2024-03-22 DIAGNOSIS — S0502XD Injury of conjunctiva and corneal abrasion without foreign body, left eye, subsequent encounter: Secondary | ICD-10-CM | POA: Diagnosis not present

## 2024-03-26 DIAGNOSIS — F331 Major depressive disorder, recurrent, moderate: Secondary | ICD-10-CM | POA: Diagnosis not present

## 2024-03-26 DIAGNOSIS — E890 Postprocedural hypothyroidism: Secondary | ICD-10-CM | POA: Diagnosis not present

## 2024-03-27 ENCOUNTER — Ambulatory Visit: Admitting: Neurology

## 2024-03-27 ENCOUNTER — Encounter: Payer: Self-pay | Admitting: Neurology

## 2024-03-27 VITALS — BP 98/62 | HR 79 | Ht 66.0 in | Wt 143.0 lb

## 2024-03-27 DIAGNOSIS — F32A Depression, unspecified: Secondary | ICD-10-CM

## 2024-03-27 DIAGNOSIS — G43709 Chronic migraine without aura, not intractable, without status migrainosus: Secondary | ICD-10-CM | POA: Diagnosis not present

## 2024-03-27 DIAGNOSIS — R410 Disorientation, unspecified: Secondary | ICD-10-CM

## 2024-03-27 MED ORDER — TOPIRAMATE 100 MG PO TABS: 100.0000 mg | ORAL_TABLET | Freq: Two times a day (BID) | ORAL | 3 refills | Status: AC

## 2024-03-27 MED ORDER — RIZATRIPTAN BENZOATE 10 MG PO TBDP: 10.0000 mg | ORAL_TABLET | ORAL | 11 refills | Status: AC | PRN

## 2024-03-27 MED ORDER — QULIPTA 60 MG PO TABS: 60.0000 mg | ORAL_TABLET | Freq: Every day | ORAL | 11 refills | Status: DC

## 2024-03-27 NOTE — Progress Notes (Signed)
 ASSESSMENT AND PLAN 53 y.o. year old female   Chronic migraine headache -Stop Ajovy , switch to Qulipta 60 mg daily for migraine prevention -Continue Topamax  100 mg twice a day -Continue Maxalt  melt 10 mg, Ubrelvy  100 mg as needed, also has -Phenergan  for severe migraines -Limit treating migraines to no more than 3 days a week    2. Worsening memory loss, transient confusion episode, in the setting of worsening mood disorder since 2024 -72-hour ambulatory EEG was normal, had 10 pushbutton events that did not correlate with EEG changes, suggesting nonepileptic events -MRI of the brain was normal in May 2024 -Neuropsychological evaluation at [facility] felt cognitive functioning issues were related to mental health factors and poor sleep habits  3. Hyperthyroidism - Normal now, on Synthroid  4.  Excessive daytime fatigue, sleepiness, ESS 17 - Sleep consultation  5.  Mood disorder, significant anxiety and depression - Continue follow-up with psychiatry  Follow-up with me in 6 months to see how Rose Austin is doing   HISTORY: Rose Austin is a 53 year old female, seen in request by her primary care PA Katina Pfeiffer, for evaluation of migraine headaches, initial evaluation was on May 12, 2018.   I have reviewed and summarized the referring note from the referring physician.  She had a past medical history of hypothyroidism, on supplement.   She had a history of migraine headaches since her 60s, trigger for migraines are stress, sleep deprivation, weather changes, around 2017, she was getting frequent migraine headaches, her typical migraine are left-sided severe pounding headache with associated light noise sensitivity, nauseous, she was seen by neurologist, giving preventive medications Topamax  50 mg twice a day since 2018, also use Cefaly device, which has helpful,   For a while she was taking frequent over-the-counter medications ibuprofen, Excedrin Migraine, 20 migraine headache  days in 1 month, she brought a migraine journal, she is now having 7-12 migraine days each month, takes Zomig 3-4 times each month as needed, but still taking frequent Excedrin Migraine, ibuprofen,   She has never tried Maxalt  in the past, limited response to Imitrex.   UPDATE January 04 2019: She now has migraine 4-6 /month, add on Ajovy  has made big difference, previously she was having 18-20 migraine days each month, her migraine is better controlled with Maxalt ,  UPDATE May 2nd 2024: She had transient memory loss on March 15th, lasting for 5 hours, witnessed by her roommate, presented to ER,   Roommate woke up from his nap, found she was not at home, she was lost for few hours, would not pick up her phone, she was walking her dog for few hours, but has no memory of it, kept on repeating herself,  acting strangely.  Presented to ER, CT head was normal.  Labs, UDS was negative, WBC was low 2.8, Hg 12.2, CMP was normal.  She was receiving BOTOX  injection to her right calf at [facility] by Dr. Harlene Cork for right lower extremity focal dystonia, last injection was on August 18 2021, used BOTOX 450 units.  She complains of excessive stress.  Recent TSH was 8.33, see endocrinologist,   UPDATE Nov 4th 2024: TSH  0.040, elevated Free T4. Thyroid  dose was adjusted accordingly.  She was out with her friend, she was acting strange around March 25th 2024, she was going to sleep, 2 second at a time, woke up, making comments that does not make sense, lasted about one day, was noted by her friends.  She described episode, she woke  up in the morning, in an awkward position, she has change pajama into a regular shirt, has no recollection of the event in July 2024.   Personally reviewed MRI brain w/wo in May 2024 was normal. EEG was normal.   She has 9 migraines in 30 days, taking Topamax  50mg  bid, tolerate it well, Maxalt  dissolvable as needed, Ubrevyl as needed was helpful  Update March 27, 2024 SS: 72-hour ambulatory video EEG showed no seizures.  There were 10 pushbutton events described as migraines, dizziness, fatigue, hypotension without EEG changes.  Those events were nonepileptic. Recent TSH was normal. Gets Botox to right leg at [facility] Dr. Corlis August 2025. Had neuro psych at [facility], felt cognitive functioning issues were related to mental health factors and poor sleep habits. Remains on Ajovy  225 mg, higher dose Topamax  100 mg BID. # of migraines have decreased. Ubrelvy  works great. Topical lidocaine  patches work great. 6-8 migraines a month. Right now with patch and rescue meds, quick relief. Does notice wearing off of Ajovy . This year, a few spells of altered awareness, same spells she had while wearing EEG that did not correlate with EEG. ESS 17.  Concerned about spells of intense fatigue, wanting to fall asleep, significant daytime drowsiness. Underlying mood disorder, sees psychiatry.  REVIEW OF SYSTEMS: Out of a complete 14 system review of symptoms, the patient complains only of the following symptoms, and all other reviewed systems are negative.  See HPI  PHYSICAL EXAM  Vitals:   10/07/22 1333  BP: 110/72  Pulse: 82  Weight: 128 lb 8 oz (58.3 kg)  Height: 5' 6 (1.676 m)   Body mass index is 20.74 kg/m.  Physical Exam  General: The patient is alert and cooperative at the time of the examination. Tired, depressed female.   Skin: No significant peripheral edema is noted.  Neurologic Exam  Mental status: The patient is alert and oriented x 3 at the time of the examination. The patient has apparent normal recent and remote memory, with an apparently normal attention span and concentration ability.  Cranial nerves: Facial symmetry is present. Speech is normal but a little slow, takes some time to answer questions, considers answer.  Extraocular movements are full. Visual fields are full.  Motor: The patient has good strength in all 4 extremities, mild  increased tone to right leg  Sensory examination: Soft touch sensation is symmetric on the face, arms, and legs.  Coordination: The patient has good finger-nose-finger and heel-to-shin bilaterally.  Gait and station: The patient has a normal gait with mild spastic gait with right leg  Reflexes: Deep tendon reflexes are symmetric.  ALLERGIES: No Known Allergies  HOME MEDICATIONS: Outpatient Medications Prior to Visit  Medication Sig Dispense Refill   Ascorbic Acid (VITAMIN C) 100 MG tablet Take 100 mg by mouth daily.     buPROPion (WELLBUTRIN SR) 200 MG 12 hr tablet Take 200 mg by mouth 2 (two) times daily.     cetirizine (ZYRTEC) 10 MG tablet Take 10 mg by mouth daily.     Ciclopirox 1 % shampoo Apply 1 application topically at bedtime.      clobetasol (TEMOVATE) 0.05 % external solution Apply 1 application topically 2 (two) times daily.     clonazePAM (KLONOPIN) 0.5 MG tablet Take 0.25-0.75 mg by mouth See admin instructions. Take 0.75mg  qam, 0.25mg  at noon     Coenzyme Q10 (COQ10) 100 MG CAPS Take 100 mg by mouth 2 (two) times daily.     conjugated estrogens (  PREMARIN) vaginal cream Place 1 applicator vaginally daily.     Esketamine HCl, 84 MG Dose, (SPRAVATO, 84 MG DOSE,) 28 MG/DEVICE SOPK Place into the nose.     fluocinonide ointment (LIDEX) 0.05 % Apply 1 application topically 2 (two) times daily as needed (Dermatitis). Hands     Fremanezumab -vfrm (AJOVY ) 225 MG/1.5ML SOSY INJECT 225 MG INTO THE SKIN EVERY 30 (THIRTY) DAYS. 1.5 mL 11   hydroxychloroquine (PLAQUENIL) 200 MG tablet Take 300 mg by mouth daily.     ibuprofen (ADVIL) 200 MG tablet Take 200 mg by mouth as needed.     L-Methylfolate (DEPLIN PO) Take 1 tablet by mouth at bedtime.      levothyroxine (SYNTHROID) 137 MCG tablet Take 137 mcg by mouth.     Multiple Vitamin (MULTIVITAMIN) tablet Take 1 tablet by mouth at bedtime.      Nerve Stimulator (CEFALY KIT) DEVI by Does not apply route daily.      Probiotic Product  (PROBIOTIC PO) Take 1 capsule by mouth daily.     promethazine  (PHENERGAN ) 25 MG tablet TAKE 1 TABLET BY MOUTH EVERY 8 HOURS AS NEEDED FOR NAUSEA OR VOMITING. 30 tablet 5   rizatriptan  (MAXALT ) 10 MG tablet TAKE 1 TAB AT ONSET OF MIGRAINE. MAY REPEAT IN 2 HRS, IF NEEDED. MAX 2 TABS/DAY 30 DAY SUPPLY. 10 tablet 5   rizatriptan  (MAXALT -MLT) 10 MG disintegrating tablet TAKE 1 TABLET BY MOUTH AS NEEDED FOR MIGRAINE. MAY REPEAT IN 2 HOURS IF NEEDED 9 tablet 11   Sod Fluoride-Potassium Nitrate 1.1-5 % PSTE Place 1 application onto teeth at bedtime.      topiramate  (TOPAMAX ) 100 MG tablet Take 1 tablet (100 mg total) by mouth 2 (two) times daily. 180 tablet 3   tranylcypromine (PARNATE) 10 MG tablet Take 10 mg by mouth in the morning, at noon, in the evening, and at bedtime.     traZODone (DESYREL) 50 MG tablet Take 25-50 mg by mouth at bedtime.     UBRELVY  100 MG TABS TAKE 100 MG BY MOUTH AS NEEDED (TAKE 1 HEADACHE AT ONSET OF HEADACHE, MAY REPEAT IN 2 HOURS IF NEEDED, MAX IS 200 MG IN 24 HOURS). 10 tablet 11   No facility-administered medications prior to visit.    PAST MEDICAL HISTORY: Past Medical History:  Diagnosis Date   Anterior pituitary hyperfunction    Anxiety    Benign neoplasm of skin    Depression    Focal dystonia    Gait disorder    Genetic torsion dystonia    Hypotension    Intractable chronic migraine without aura and without status migrainosus    Iodine hypothyroidism    Irregular menses    Other neutropenia    Seborrheic dermatitis    Thrombosed external hemorrhoid     PAST SURGICAL HISTORY: Past Surgical History:  Procedure Laterality Date   BREAST BIOPSY Left 09/23/2020   HYMENECTOMY      FAMILY HISTORY: Family History  Problem Relation Age of Onset   Dementia Mother    Hypertension Mother    Colon cancer Maternal Grandmother    Lung cancer Paternal Grandfather    Dementia Maternal Grandfather    Hypertension Maternal Grandfather    Breast cancer  Paternal Grandmother     SOCIAL HISTORY: Social History   Socioeconomic History   Marital status: Divorced    Spouse name: Not on file   Number of children: 0   Years of education: college   Highest education level: Master's degree (  e.g., MA, MS, MEng, MEd, MSW, MBA)  Occupational History   Occupation: Does not work  Tobacco Use   Smoking status: Never   Smokeless tobacco: Never  Vaping Use   Vaping status: Never Used  Substance and Sexual Activity   Alcohol use: Yes    Alcohol/week: 2.0 standard drinks of alcohol    Types: 2 Glasses of wine per week    Comment: 1-2 glasses wine a week   Drug use: Never   Sexual activity: Yes  Other Topics Concern   Not on file  Social History Narrative   Lives at home alone.   Right-handed.   1 cup caffeine per day.   Social Drivers of Corporate investment banker Strain: Not on file  Food Insecurity: Not on file  Transportation Needs: Not on file  Physical Activity: Not on file  Stress: Not on file  Social Connections: Not on file  Intimate Partner Violence: Not on file   Lauraine Born, SCHARLENE, DNP  [facility] [address], [address] [place], [place] [address] [phone]

## 2024-03-27 NOTE — Patient Instructions (Signed)
 Switch from Ajovy  to Qulipta for migraine prevention  Referral for sleep consult  Continue other medications  Limits treating migraines to no more than 2-3 days a week to minimize risk for rebound headache

## 2024-03-29 ENCOUNTER — Other Ambulatory Visit: Payer: Self-pay | Admitting: Neurology

## 2024-04-11 DIAGNOSIS — M79643 Pain in unspecified hand: Secondary | ICD-10-CM | POA: Diagnosis not present

## 2024-04-11 DIAGNOSIS — M1990 Unspecified osteoarthritis, unspecified site: Secondary | ICD-10-CM | POA: Diagnosis not present

## 2024-04-11 DIAGNOSIS — M151 Heberden's nodes (with arthropathy): Secondary | ICD-10-CM | POA: Diagnosis not present

## 2024-04-11 DIAGNOSIS — R21 Rash and other nonspecific skin eruption: Secondary | ICD-10-CM | POA: Diagnosis not present

## 2024-04-12 ENCOUNTER — Encounter: Payer: Self-pay | Admitting: Neurology

## 2024-04-12 ENCOUNTER — Ambulatory Visit (INDEPENDENT_AMBULATORY_CARE_PROVIDER_SITE_OTHER): Admitting: Neurology

## 2024-04-12 VITALS — BP 120/66 | HR 79 | Ht 66.0 in | Wt 142.6 lb

## 2024-04-12 DIAGNOSIS — R400 Somnolence: Secondary | ICD-10-CM | POA: Diagnosis not present

## 2024-04-12 DIAGNOSIS — R410 Disorientation, unspecified: Secondary | ICD-10-CM | POA: Diagnosis not present

## 2024-04-12 DIAGNOSIS — Z9189 Other specified personal risk factors, not elsewhere classified: Secondary | ICD-10-CM

## 2024-04-12 DIAGNOSIS — Z79899 Other long term (current) drug therapy: Secondary | ICD-10-CM | POA: Diagnosis not present

## 2024-04-12 NOTE — Progress Notes (Signed)
 Subjective:    Patient ID: Rose Austin is a 53 y.o. female.  HPI    True Mar, MD, PhD [facility] [address] [address] [place], [place] [address]  Dear Lauraine,  I saw your patient, Rose Austin, upon your kind request in my sleep clinic today for initial consultation of her sleep disorder, in particular, concern for underlying obstructive sleep apnea.  The patient is unaccompanied today.  As you know, Rose Austin is a 53 year old female with an underlying medical history of focal dystonia, inflammatory arthritis gait disorder, history of Graves' disease, status post RAI, hypertension, hypothyroidism, migraine headaches, anxiety, and depression, who reports significant daytime somnolence.  Her Epworth sleepiness score is 20 out of 24, fatigue severity score is 51 out of 63.  I reviewed your office note from 03/27/2024.  Of note, she is on multiple medications including psychotropic medications and potentially sedating medications including Topamax , clonazepam, trazodone, Lamictal, and she also takes Wellbutrin.   She reports episodes of confusion and episodes of amnesia.  She reports sudden episodes of overwhelming sleepiness.  She has been driving.  She works part-time.  She lives with a part-time roommate.  She reports difficulty with adjustment of her thyroid  function.  She does take multiple medications and was recently started on Lamictal, currently 75 mg at bedtime.  She also has a prescription for trazodone, she takes 25 mg as needed at bedtime, usually twice a week.  She is followed by psychiatry.  She is also followed by rheumatology for inflammatory arthritis and is on Plaquenil.  She has been on clonazepam for years, she takes 1 mg in the morning and 0.5 mg midday.  She gets Botox injections for her focal dystonia at [facility].  She drinks caffeine in the form of latte coffees, 2 servings in the mornings.  She drinks alcohol once  or twice a week or once or twice every 2 weeks.  She is a non-smoker.  She is divorced.  She has had increasing daytime somnolence and fatigue for years.  She reports chronic sleep difficulty since her divorce in 2019.  She is not aware of any family history of sleep apnea.  She is not known to snore as far she knows.  Her Past Medical History Is Significant For: Past Medical History:  Diagnosis Date   Anterior pituitary hyperfunction    Anxiety    Benign neoplasm of skin    Depression    Focal dystonia    Gait disorder    Genetic torsion dystonia    Hypotension    Intractable chronic migraine without aura and without status migrainosus    Iodine hypothyroidism    Irregular menses    Other neutropenia    Seborrheic dermatitis    Thrombosed external hemorrhoid     Her Past Surgical History Is Significant For: Past Surgical History:  Procedure Laterality Date   BREAST BIOPSY Left 09/23/2020   HYMENECTOMY      Her Family History Is Significant For: Family History  Problem Relation Age of Onset   Migraines Mother    Dementia Mother    Hypertension Mother    Alzheimer's disease Mother    Colon cancer Maternal Grandmother    Dementia Maternal Grandfather    Hypertension Maternal Grandfather    Breast cancer Paternal Grandmother    Lung cancer Paternal Grandfather    Seizures Neg Hx    Stroke Neg Hx    Sleep apnea Neg Hx  Her Social History Is Significant For: Social History   Socioeconomic History   Marital status: Divorced    Spouse name: Not on file   Number of children: 0   Years of education: college   Highest education level: Master's degree (e.g., MA, MS, MEng, MEd, MSW, MBA)  Occupational History   Occupation: Does not work  Tobacco Use   Smoking status: Never   Smokeless tobacco: Never  Vaping Use   Vaping status: Never Used  Substance and Sexual Activity   Alcohol use: Yes    Alcohol/week: 2.0 standard drinks of alcohol    Types: 2 Glasses of wine  per week    Comment: 1-2 glasses wine a week   Drug use: Never   Sexual activity: Yes  Other Topics Concern   Not on file  Social History Narrative   Lives at home alone.   Right-handed.   1 cup caffeine per day.   Social Drivers of Corporate Investment Banker Strain: Not on file  Food Insecurity: Not on file  Transportation Needs: Not on file  Physical Activity: Not on file  Stress: Not on file  Social Connections: Not on file    Her Allergies Are:  No Known Allergies:   Her Current Medications Are:  Outpatient Encounter Medications as of 04/12/2024  Medication Sig   Ascorbic Acid (VITAMIN C) 100 MG tablet Take 100 mg by mouth daily.   buPROPion (WELLBUTRIN SR) 200 MG 12 hr tablet Take 200 mg by mouth 2 (two) times daily.   cetirizine (ZYRTEC) 10 MG tablet Take 10 mg by mouth daily.   Ciclopirox 1 % shampoo Apply 1 application topically at bedtime.    clobetasol (TEMOVATE) 0.05 % external solution Apply 1 application topically 2 (two) times daily.   clonazePAM (KLONOPIN) 0.5 MG tablet Take 0.25-0.75 mg by mouth See admin instructions. Take 0.75mg  qam, 0.25mg  at noon   Coenzyme Q10 (COQ10) 100 MG CAPS Take 100 mg by mouth 2 (two) times daily.   conjugated estrogens (PREMARIN) vaginal cream Place 1 applicator vaginally daily.   Esketamine HCl, 84 MG Dose, (SPRAVATO, 84 MG DOSE,) 28 MG/DEVICE SOPK Place into the nose.   fluocinonide ointment (LIDEX) 0.05 % Apply 1 application topically 2 (two) times daily as needed (Dermatitis). Hands   hydroxychloroquine (PLAQUENIL) 200 MG tablet Take 300 mg by mouth daily.   ibuprofen (ADVIL) 200 MG tablet Take 200 mg by mouth as needed.   L-Methylfolate (DEPLIN PO) Take 1 tablet by mouth at bedtime.    lamoTRIgine (LAMICTAL) 25 MG tablet    levothyroxine (SYNTHROID) 112 MCG tablet Take 112 mcg by mouth daily before breakfast.   Multiple Vitamin (MULTIVITAMIN) tablet Take 1 tablet by mouth at bedtime.    Nerve Stimulator (CEFALY KIT) DEVI by  Does not apply route daily.    Probiotic Product (PROBIOTIC PO) Take 1 capsule by mouth daily.   promethazine  (PHENERGAN ) 25 MG tablet TAKE 1 TABLET BY MOUTH EVERY 8 HOURS AS NEEDED FOR NAUSEA OR VOMITING.   rizatriptan  (MAXALT -MLT) 10 MG disintegrating tablet Take 1 tablet (10 mg total) by mouth as needed for migraine. May repeat in 2 hours if needed   Sod Fluoride-Potassium Nitrate 1.1-5 % PSTE Place 1 application onto teeth at bedtime.    topiramate  (TOPAMAX ) 100 MG tablet Take 1 tablet (100 mg total) by mouth 2 (two) times daily.   tranylcypromine (PARNATE) 10 MG tablet Take 10 mg by mouth in the morning, at noon, in the evening, and  at bedtime.   traZODone (DESYREL) 50 MG tablet Take 25-50 mg by mouth at bedtime.   UBRELVY  100 MG TABS TAKE 100 MG BY MOUTH AS NEEDED (TAKE 1 HEADACHE AT ONSET OF HEADACHE, MAY REPEAT IN 2 HOURS IF NEEDED, MAX IS 200 MG IN 24 HOURS).   Atogepant (QULIPTA) 60 MG TABS Take 1 tablet (60 mg total) by mouth daily. (Patient not taking: Reported on 04/12/2024)   No facility-administered encounter medications on file as of 04/12/2024.  :   Review of Systems:  Out of a complete 14 point review of systems, all are reviewed and negative with the exception of these symptoms as listed below:   Review of Systems  Objective:  Neurological Exam  Physical Exam Physical Examination:   Vitals:   04/12/24 1433  BP: 120/66  Pulse: 79    General Examination: The patient is a very pleasant 53 y.o. female in no acute distress. She appears well-developed and well-nourished and well groomed.   HEENT: Normocephalic, atraumatic, pupils are equal, round and reactive to light, extraocular tracking is good without limitation to gaze excursion or nystagmus noted. No photophobia. Corrective eye glasses in place. Hearing is grossly intact.  Face is symmetric with normal facial animation. Speech is clear without dysarthria. There is no hypophonia. There is no lip, neck/head, jaw or  voice tremor. Neck is supple with full range of passive and active motion. There are no carotid bruits on auscultation.  Airway/Oropharynx exam reveals: moderate mouth dryness, adequate dental hygiene and mild airway crowding, due to small airway entry, otherwise benign findings, Mallampati class I, tonsils rather small.  Neck circumference 13 inches.  Tongue protrudes centrally and palate elevates symmetrically.  Chest: Clear to auscultation without wheezing, rhonchi or crackles noted.  Heart: S1+S2+0, regular and normal without murmurs, rubs or gallops noted.   Abdomen: Soft, non-tender and non-distended.  Extremities: There is no pitting edema in the distal lower extremities bilaterally.   Skin: Warm and dry without trophic changes noted.   Musculoskeletal: exam reveals no obvious joint deformities.   Neurologically:  Mental status: The patient is awake, alert and oriented in all 4 spheres. Her immediate and remote memory, attention, language skills and fund of knowledge are appropriate. There is no evidence of aphasia, agnosia, apraxia or anomia. Speech is clear with normal prosody and enunciation. Thought process is linear. Mood is normal and affect is normal.  Cranial nerves II - XII are as described above under HEENT exam.  Motor exam: Normal bulk, moving all 4 extremities without restriction, no obvious action or resting tremor.  Fine motor skills and coordination: Intact grossly.  Cerebellar testing: No dysmetria or intention tremor. There is no truncal or gait ataxia.  Sensory exam: intact to light touch in the upper and lower extremities.  Gait, station and balance: She stands easily. No veering to one side is noted. No leaning to one side is noted. Posture is age-appropriate and stance is narrow based. Gait shows normal stride length and normal pace. No problems turning are noted.   Assessment and Plan:   In summary, Rose Austin is a 53 year old female with an underlying  medical history of focal dystonia, inflammatory arthritis gait disorder, history of Graves' disease, status post RAI, hypertension, hypothyroidism, migraine headaches, anxiety, episodic confusion and depression, who presents for evaluation of her significant daytime somnolence with worsening sleepiness reported and episodes of confusion and episodes of amnesia.  She has a high sleepiness score and reports episodes of overwhelming sleepiness with  need to sleep.  She is highly cautioned regarding her driving as she indicates that she still drives.  She is advised that she is at high risk of falling asleep while driving or if she has an episodes of confusion while driving her vigilance can be impaired.  She is strongly advised not to drive.  We talked about the possibility of medication effect as well, including polypharmacy leading to daytime somnolence and intermittent confusion as well as amnesia.  Sleep disordered breathing is not completely excluded such as obstructive sleep apnea (OSA). A laboratory attended sleep study is typically considered gold standard for evaluation of sleep disordered breathing.   I had a long chat with the patient about my findings and the diagnosis of sleep apnea, particularly OSA, its prognosis and treatment options. We talked about medical/conservative treatments, surgical interventions and non-pharmacological approaches for symptom control. I explained, in particular, the risks and ramifications of untreated moderate to severe OSA, especially with respect to developing cardiovascular disease down the road, including congestive heart failure (CHF), difficult to treat hypertension, cardiac arrhythmias (particularly A-fib), neurovascular complications including TIA, stroke and dementia. Even type 2 diabetes has, in part, been linked to untreated OSA. Symptoms of untreated OSA may include (but may not be limited to) daytime sleepiness, nocturia (i.e. frequent nighttime urination),  memory problems, mood irritability and suboptimally controlled or worsening mood disorder such as depression and/or anxiety, lack of energy, lack of motivation, physical discomfort, as well as recurrent headaches, especially morning or nocturnal headaches. We talked about the importance of maintaining a healthy lifestyle and striving for healthy weight. In addition, we talked about the importance of striving for and maintaining good sleep hygiene.  She is furthermore advised to avoid drinking alcohol altogether as alcohol is a known sleep disrupter, may contribute to amnesia, and may not combine well with her current medications.  She demonstrated understanding. I recommended a sleep study at this time. I outlined the differences between a laboratory attended sleep study which is considered more comprehensive and accurate over the option of a home sleep test (HST); the latter may lead to underestimation of sleep disordered breathing in some instances and does not help with diagnosing upper airway resistance syndrome and is not accurate enough to diagnose primary central sleep apnea typically. I outlined possible surgical and non-surgical treatment options of OSA, including the use of a positive airway pressure (PAP) device (i.e. CPAP, AutoPAP/APAP or BiPAP in certain circumstances), a custom-made dental device (aka oral appliance, which would require a referral to a specialist dentist or orthodontist typically, and is generally speaking not considered for patients with full dentures or edentulous state), upper airway surgical options, such as traditional UPPP (which is not considered a first-line treatment) or the Inspire device (hypoglossal nerve stimulator, which would involve a referral for consultation with an ENT surgeon, after careful selection, following inclusion criteria - also not first-line treatment). I explained the PAP treatment option to the patient in detail, as this is generally considered  first-line treatment.  The patient indicated that she would be willing to try PAP therapy, if the need arises.  We talked about evaluation for narcolepsy or hypersomnolence disorders.  In order to proceed with extended testing with a daytime sleep study preceded by a nighttime sleep study she would have to taper off her psychotropic medications, including the Topamax , Wellbutrin, Klonopin, trazodone, and Lamictal.  We mutually agreed to proceed with a nighttime sleep study at this time. We will pick up our discussion about  the next steps and treatment options after testing.  We will keep her posted as to the test results by phone call and/or MyChart messaging where possible.  We will plan to follow-up in sleep clinic accordingly as well.  I answered all her questions today and the patient was in agreement.   I encouraged her to call with any interim questions, concerns, problems or updates or email us  through MyChart.  Generally speaking, sleep test authorizations may take up to 2 weeks, sometimes less, sometimes longer, the patient is encouraged to get in touch with us  if they do not hear back from the sleep lab staff directly within the next 2 weeks.  Thank you very much for allowing me to participate in the care of this nice patient. If I can be of any further assistance to you please do not hesitate to talk to me.  Sincerely,   True Mar, MD, PhD

## 2024-04-12 NOTE — Patient Instructions (Addendum)
 Thank you for choosing [facility] for your sleep related care! It was nice to meet you today!   Here is what we discussed today:   Given that you have had episodes of overwhelming sleepiness without any warning and episodes of amnesia, I recommend that you not drive at this point.  You are at high risk of falling asleep while driving or having an episode of confusion while driving which can impair your ability to react and impair your vigilance.  I also highly recommend that you abstain from drinking any alcohol as alcohol is a known sleep disrupter, and can cause amnesia and may not combine well with multiple of your medications.   Based on your symptoms and your exam I believe you are at risk for obstructive sleep apnea (aka OSA). We should proceed with a sleep study to determine whether you do or do not have OSA and how severe it is. Even, if you have mild OSA, I may want you to consider treatment with CPAP, as treatment of even borderline or mild sleep apnea can result and improvement of symptoms such as sleep disruption, daytime sleepiness, nighttime bathroom breaks, restless leg symptoms, improvement of headache syndromes, even improved mood disorder.   As explained, an attended sleep study (meaning you get to stay overnight in the sleep lab), lets us  monitor sleep-related behaviors such as sleep talking and leg movements in sleep, in addition to monitoring for sleep apnea.  A home sleep test is a screening tool for sleep apnea diagnosis only, but unfortunately, does not help with any other sleep-related diagnoses.  Please remember, the long-term risks and ramifications of untreated moderate to severe obstructive sleep apnea may include (but are not limited to): increased risk for cardiovascular disease, including congestive heart failure, stroke, difficult to control hypertension, treatment resistant obesity, arrhythmias, especially irregular heartbeat commonly known as A. Fib.  (atrial fibrillation); even type 2 diabetes has been linked to untreated OSA.   Other correlations that untreated obstructive sleep apnea include macular edema which is swelling of the retina in the eyes, droopy eyelid syndrome, and elevated hemoglobin and hematocrit levels (often referred to as polycythemia).  Sleep apnea can cause disruption of sleep and sleep deprivation in most cases, which, in turn, can cause recurrent headaches, problems with memory, mood, concentration, focus, and vigilance. Most people with untreated sleep apnea report excessive daytime sleepiness, which can affect their ability to drive. Please do not drive or use heavy equipment or machinery, if you feel sleepy! Patients with sleep apnea can also develop difficulty initiating and maintaining sleep (aka insomnia).   Having sleep apnea may increase your risk for other sleep disorders, including involuntary behaviors sleep such as sleep terrors, sleep talking, sleepwalking.    Having sleep apnea can also increase your risk for restless leg syndrome and leg movements at night.   Please note that untreated obstructive sleep apnea may carry additional perioperative morbidity. Patients with significant obstructive sleep apnea (typically, in the moderate to severe degree) should receive, if possible, perioperative PAP (positive airway pressure) therapy and the surgeons and particularly the anesthesiologists should be informed of the diagnosis and the severity of the sleep disordered breathing.   We will call you or email you through MyChart with regards to your test results and plan a follow-up in sleep clinic accordingly. Most likely, you will hear from one of our nurses.   Our sleep lab administrative assistant will call you to schedule your sleep study and give you further  instructions, regarding the check in process for the sleep study, arrival time, what to bring, when you can expect to leave after the study, etc., and to  answer any other logistical questions you may have. If you don't hear back from her by about 2 weeks from now, please feel free to call her direct line at [phone] or you can call our general clinic number, or email us  through My Chart.

## 2024-04-20 DIAGNOSIS — R760 Raised antibody titer: Secondary | ICD-10-CM | POA: Diagnosis not present

## 2024-04-20 DIAGNOSIS — M1990 Unspecified osteoarthritis, unspecified site: Secondary | ICD-10-CM | POA: Diagnosis not present

## 2024-04-20 DIAGNOSIS — Z79899 Other long term (current) drug therapy: Secondary | ICD-10-CM | POA: Diagnosis not present

## 2024-04-27 ENCOUNTER — Ambulatory Visit: Admitting: Neurology

## 2024-04-27 DIAGNOSIS — R400 Somnolence: Secondary | ICD-10-CM

## 2024-04-27 DIAGNOSIS — R410 Disorientation, unspecified: Secondary | ICD-10-CM

## 2024-04-27 DIAGNOSIS — Z79899 Other long term (current) drug therapy: Secondary | ICD-10-CM

## 2024-04-27 DIAGNOSIS — G4733 Obstructive sleep apnea (adult) (pediatric): Secondary | ICD-10-CM | POA: Diagnosis not present

## 2024-04-27 DIAGNOSIS — Z9189 Other specified personal risk factors, not elsewhere classified: Secondary | ICD-10-CM

## 2024-05-17 ENCOUNTER — Other Ambulatory Visit: Payer: Self-pay | Admitting: Family Medicine

## 2024-05-17 DIAGNOSIS — N6310 Unspecified lump in the right breast, unspecified quadrant: Secondary | ICD-10-CM

## 2024-05-30 ENCOUNTER — Encounter: Payer: Self-pay | Admitting: Neurology

## 2024-06-04 NOTE — Progress Notes (Unsigned)
 See procedure note.

## 2024-06-05 ENCOUNTER — Ambulatory Visit: Payer: Self-pay | Admitting: Neurology

## 2024-06-05 DIAGNOSIS — G4733 Obstructive sleep apnea (adult) (pediatric): Secondary | ICD-10-CM

## 2024-06-05 DIAGNOSIS — G4719 Other hypersomnia: Secondary | ICD-10-CM

## 2024-06-05 NOTE — Procedures (Signed)
 "   [facility]  HOME SLEEP TEST (SANSA) REPORT (Mail-Out Device):   STUDY DATE: 05/10/24  DOB: 02/07/1971  MRN: [id number]  ORDERING CLINICIAN: True Mar, MD, PhD   REFERRING CLINICIAN: Lauraine Born, NP  CLINICAL INFORMATION/HISTORY (obtained from visit note dated 04/12/24): 53 year old female with an underlying medical history of focal dystonia, inflammatory arthritis gait disorder, history of Graves' disease, status post RAI, hypertension, hypothyroidism, migraine headaches, anxiety, and depression, who reports significant daytime somnolence.    PATIENT'S LAST REPORTED EPWORTH SLEEPINESS SCORE (ESS): 20/24.  BMI (at the time of sleep clinic visit and/or test date): 23 kg/m  FINDINGS:   Study Protocol:    The SANSA single-point-of-skin-contact chest-worn sensor - an FDA cleared and DOT approved type 4 home sleep test device - measures eight physiological channels,  including blood oxygen saturation (measured via PPG [photoplethysmography]), EKG-derived heart rate, respiratory effort, chest movement (measured via accelerometer), snoring, body position, and actigraphy. The device is designed to be worn for up to 10 hours per study.   Sleep Summary:   Total Recording Time (hours, min): 8 hours, 14 min  Total Effective Sleep Time (hours, min):  6 hours, 43 min  Sleep Efficiency (%):    82%   Respiratory Indices:   Calculated sAHI (per hour):  6.7/hour        Calculated sAHIc (central AHI per hour):  1.3/hour  Oxygen Saturation Statistics:    Oxygen Saturation (%) Mean: 96.1%   Minimum oxygen saturation (%):                 81.7%   O2 Saturation Range (%): 81.7-100%   Time below or at 88% saturation: <1 min   Pulse Rate Statistics:   Pulse Mean (bpm):    73/min    Pulse Range (60-116/min)   Snoring: Mild  IMPRESSION/DIAGNOSES:   OSA (obstructive sleep apnea), mild   RECOMMENDATIONS:   This home sleep test demonstrates overall mild  obstructive sleep apnea with a total AHI of 6.7/hour and O2 nadir of 81.7%. Snoring was detected, in the mild range. Given the patient's medical history and sleep related complaints, therapy with a positive airway pressure device is a reasonable first-line choice and clinically recommended. Treatment can be achieved in the form of autoPAP trial/titration at home for now.  I recommend initial treatment settings of 5 to 11 cm with EPR of 2, mask of choice, sized to fit.  A full night, in-lab PAP titration study may aid in improving proper treatment settings and with mask fit, if needed, down the road.  Alternative treatments - generally speaking - may include weight loss (where clinically appropriate) along with avoidance of the supine sleep position (if possible), or an oral appliance in appropriate candidates.   Please note that untreated obstructive sleep apnea may carry additional perioperative morbidity. Patients with significant obstructive sleep apnea should receive perioperative PAP therapy and the surgeons and particularly the anesthesiologist should be informed of the diagnosis and the severity of the sleep disordered breathing. The patient should be cautioned not to drive, work at heights, or operate dangerous or heavy equipment when tired or sleepy. Review and reiteration of good sleep hygiene measures should be pursued with any patient. Other causes of the patient's symptoms, including circadian rhythm disturbances, an underlying mood disorder, medication effect and/or an underlying medical problem cannot be ruled out based on this test. Clinical correlation is recommended.  The patient and her referring provider will be notified of the test results. The  patient will be seen in follow up in sleep clinic at [facility], as necessary.  I certify that I have reviewed the raw data recording prior to the issuance of this report in accordance with the standards of the American Academy of Sleep Medicine  (AASM).    INTERPRETING PHYSICIAN:   True Mar, MD, PhD Medical Director, [facility]) Diplomat, ABPN (Neurology and Sleep)   [facility] [address], [address] [place], [place] [address] [phone]            "

## 2024-06-06 ENCOUNTER — Ambulatory Visit
Admission: RE | Admit: 2024-06-06 | Discharge: 2024-06-06 | Disposition: A | Discharge status: Home / Self Care | Source: Ambulatory Visit | Attending: Family Medicine | Admitting: Family Medicine

## 2024-06-06 DIAGNOSIS — N6310 Unspecified lump in the right breast, unspecified quadrant: Secondary | ICD-10-CM

## 2024-06-19 ENCOUNTER — Other Ambulatory Visit: Payer: Self-pay | Admitting: Neurology

## 2024-06-19 ENCOUNTER — Encounter: Payer: Self-pay | Admitting: Neurology

## 2024-06-19 NOTE — Telephone Encounter (Signed)
 Per 03/27/24 note. Stop Ajovy 

## 2024-06-20 ENCOUNTER — Other Ambulatory Visit: Payer: Self-pay | Admitting: Neurology

## 2024-06-22 ENCOUNTER — Telehealth: Payer: Self-pay

## 2024-06-22 ENCOUNTER — Other Ambulatory Visit (HOSPITAL_COMMUNITY): Payer: Self-pay

## 2024-06-22 NOTE — Telephone Encounter (Signed)
 Pharmacy Patient Advocate Encounter   Received notification from RX Request Messages that prior authorization for Qulipta  60MG  tablets is required/requested.   Insurance verification completed.   The patient is insured through [facility].   Prior Authorization for Qulipta  60MG  tablets has been APPROVED from 06-22-2024 to 06-22-2025. Ran test claim, Copay is $0.00. This test claim was processed through [facility]- copay amounts may vary at other pharmacies due to pharmacy/plan contracts, or as the patient moves through the different stages of their insurance plan.   PA #/Case ID/Reference #: [facility]

## 2024-06-22 NOTE — Telephone Encounter (Signed)
 PA approved. Patient has been messaged and informed.

## 2024-06-28 NOTE — Telephone Encounter (Signed)
 Qulipta  is approved.

## 2024-06-30 ENCOUNTER — Other Ambulatory Visit: Payer: Self-pay | Admitting: Neurology

## 2024-07-01 ENCOUNTER — Other Ambulatory Visit (HOSPITAL_COMMUNITY): Payer: Self-pay

## 2024-07-01 NOTE — Telephone Encounter (Signed)
 Qulipta  is approved with a $0 copayAjovy  no PA is needed $170.83 copay.

## 2024-07-03 ENCOUNTER — Telehealth: Payer: Self-pay | Admitting: Neurology

## 2024-07-03 NOTE — Telephone Encounter (Signed)
 MYC cxl

## 2024-07-04 ENCOUNTER — Other Ambulatory Visit (HOSPITAL_COMMUNITY): Payer: Self-pay

## 2024-07-04 NOTE — Telephone Encounter (Signed)
 Please sign off as then PA has been resolved

## 2024-07-05 NOTE — Telephone Encounter (Signed)
 PA request has been Approved. New Encounter has been or will be created for follow up. For additional info see Pharmacy Prior Auth telephone encounter from 06-22-2024.

## 2024-07-10 ENCOUNTER — Other Ambulatory Visit: Payer: Self-pay | Admitting: Neurology

## 2024-10-30 ENCOUNTER — Ambulatory Visit: Admitting: Neurology
